# Patient Record
Sex: Male | Born: 1945 | Race: White | Hispanic: No | Marital: Married | Smoking: Never smoker
Health system: Southern US, Community
[De-identification: ages and names within clinical notes are randomized; demographics above are authoritative.]

## PROBLEM LIST (undated history)

## (undated) DIAGNOSIS — I10 Essential (primary) hypertension: Secondary | ICD-10-CM

## (undated) DIAGNOSIS — T7840XA Allergy, unspecified, initial encounter: Secondary | ICD-10-CM

## (undated) DIAGNOSIS — J302 Other seasonal allergic rhinitis: Secondary | ICD-10-CM

## (undated) DIAGNOSIS — E079 Disorder of thyroid, unspecified: Secondary | ICD-10-CM

## (undated) DIAGNOSIS — E039 Hypothyroidism, unspecified: Secondary | ICD-10-CM

## (undated) DIAGNOSIS — R310 Gross hematuria: Secondary | ICD-10-CM

## (undated) DIAGNOSIS — C4491 Basal cell carcinoma of skin, unspecified: Secondary | ICD-10-CM

## (undated) DIAGNOSIS — N189 Chronic kidney disease, unspecified: Secondary | ICD-10-CM

## (undated) HISTORY — DX: Basal cell carcinoma of skin, unspecified: C44.91

## (undated) HISTORY — DX: Disorder of thyroid, unspecified: E07.9

## (undated) HISTORY — DX: Essential (primary) hypertension: I10

## (undated) HISTORY — PX: NO PAST SURGERIES: SHX2092

## (undated) HISTORY — DX: Allergy, unspecified, initial encounter: T78.40XA

---

## 1989-02-17 HISTORY — PX: OTHER SURGICAL HISTORY: SHX169

## 2001-06-24 ENCOUNTER — Encounter: Payer: Self-pay | Admitting: Family Medicine

## 2001-08-24 ENCOUNTER — Encounter: Payer: Self-pay | Admitting: Family Medicine

## 2003-09-25 ENCOUNTER — Encounter: Payer: Self-pay | Admitting: Family Medicine

## 2003-09-25 LAB — CONVERTED CEMR LAB: PSA: 1.3 ng/mL

## 2004-08-27 ENCOUNTER — Ambulatory Visit: Payer: Self-pay | Admitting: Family Medicine

## 2005-10-22 ENCOUNTER — Encounter: Payer: Self-pay | Admitting: Family Medicine

## 2005-11-23 ENCOUNTER — Ambulatory Visit: Payer: Self-pay | Admitting: Family Medicine

## 2006-01-11 ENCOUNTER — Ambulatory Visit: Payer: Self-pay | Admitting: Family Medicine

## 2006-01-29 HISTORY — PX: PROSTATE BIOPSY: SHX241

## 2006-02-12 ENCOUNTER — Ambulatory Visit: Payer: Self-pay | Admitting: Family Medicine

## 2006-04-08 ENCOUNTER — Ambulatory Visit: Payer: Self-pay | Admitting: Family Medicine

## 2006-08-12 ENCOUNTER — Ambulatory Visit: Payer: Self-pay | Admitting: Family Medicine

## 2006-08-26 ENCOUNTER — Encounter: Payer: Self-pay | Admitting: Family Medicine

## 2006-08-30 ENCOUNTER — Ambulatory Visit: Payer: Self-pay | Admitting: Family Medicine

## 2007-01-11 ENCOUNTER — Encounter: Payer: Self-pay | Admitting: Family Medicine

## 2007-01-12 DIAGNOSIS — I10 Essential (primary) hypertension: Secondary | ICD-10-CM | POA: Insufficient documentation

## 2007-01-12 DIAGNOSIS — E039 Hypothyroidism, unspecified: Secondary | ICD-10-CM | POA: Insufficient documentation

## 2007-01-12 DIAGNOSIS — J309 Allergic rhinitis, unspecified: Secondary | ICD-10-CM | POA: Insufficient documentation

## 2007-01-12 DIAGNOSIS — T7840XA Allergy, unspecified, initial encounter: Secondary | ICD-10-CM | POA: Insufficient documentation

## 2007-01-13 ENCOUNTER — Ambulatory Visit: Payer: Self-pay | Admitting: Family Medicine

## 2007-01-14 ENCOUNTER — Encounter: Payer: Self-pay | Admitting: Family Medicine

## 2007-09-23 ENCOUNTER — Encounter: Payer: Self-pay | Admitting: Family Medicine

## 2007-09-26 ENCOUNTER — Ambulatory Visit: Payer: Self-pay | Admitting: Family Medicine

## 2007-09-27 ENCOUNTER — Encounter: Payer: Self-pay | Admitting: Family Medicine

## 2007-12-21 ENCOUNTER — Telehealth: Payer: Self-pay | Admitting: Family Medicine

## 2008-02-28 ENCOUNTER — Encounter: Payer: Self-pay | Admitting: Family Medicine

## 2008-03-01 ENCOUNTER — Ambulatory Visit: Payer: Self-pay | Admitting: Family Medicine

## 2008-03-02 ENCOUNTER — Encounter: Payer: Self-pay | Admitting: Family Medicine

## 2008-03-07 ENCOUNTER — Encounter: Payer: Self-pay | Admitting: Family Medicine

## 2008-10-30 ENCOUNTER — Ambulatory Visit: Payer: Self-pay | Admitting: Family Medicine

## 2009-02-24 ENCOUNTER — Encounter: Payer: Self-pay | Admitting: Family Medicine

## 2009-03-07 ENCOUNTER — Ambulatory Visit: Payer: Self-pay | Admitting: Family Medicine

## 2009-06-06 ENCOUNTER — Ambulatory Visit: Payer: Self-pay | Admitting: Family Medicine

## 2009-11-18 ENCOUNTER — Ambulatory Visit: Payer: Self-pay | Admitting: Family Medicine

## 2009-12-09 ENCOUNTER — Telehealth: Payer: Self-pay | Admitting: Family Medicine

## 2010-03-06 ENCOUNTER — Encounter: Payer: Self-pay | Admitting: Family Medicine

## 2010-03-06 LAB — CONVERTED CEMR LAB
HDL: 53 mg/dL
LDL Cholesterol: 54 mg/dL

## 2010-03-10 ENCOUNTER — Ambulatory Visit: Payer: Self-pay | Admitting: Family Medicine

## 2010-03-26 ENCOUNTER — Encounter (INDEPENDENT_AMBULATORY_CARE_PROVIDER_SITE_OTHER): Payer: Self-pay | Admitting: *Deleted

## 2010-09-21 LAB — CONVERTED CEMR LAB: PSA: 3.3 ng/mL
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
## 2010-09-23 NOTE — Assessment & Plan Note (Signed)
Summary: Richard Ibarra and estabh from dr schaller/dlo   Vital Signs:  Patient profile:   65 year old male Weight:      150 pounds Temp:     98.4 degrees F oral Pulse rate:   60 / minute Pulse rhythm:   regular BP sitting:   120 / 78  (left arm) Cuff size:   regular  Vitals Entered By: Sydell Axon LPN (March 10, 2010 8:18 AM) CC: 30 Minute checkup and to get established with Dr. Dayton Martes   History of Present Illness: 65 yo new to me here for Richard Ibarra.  Works at [employer] and brings a copy of his labs in with him today.  Hypothyroidism- On Synthroid 125 micrograms daily and has been for years.  TSH this month .128, T4 1.83.  Denies any palpitations, diarrhea or other symptoms of hyperthyroidism.  hyperlipidemia- on Crestor 10 mg daily and Niaspan 1000 mg qhs.  Lipids well controlled.  Denies myalgias or other side effects.  BPH- PSA has been stable, followed by urology. Finaseteride 5 mg helps with symptoms.  Denies any urinary complaints.    HTN- well controlled on Benicar 20 mg daily and Meoprolol 50 mg daily.  Denies CP, SOB, LE edema, blurred vision.  Current Medications (verified): 1)  Synthroid 125 Mcg Tabs (Levothyroxine Sodium) .... Take One By Mouth Daily 2)  Niaspan 1000 Mg Cr-Tabs (Niacin (Antihyperlipidemic)) .... One Tab At Night 3)  Fexofenadine Hcl 180 Mg Tabs (Fexofenadine Hcl) .... One By Mouth Once Daily As Needed Springtime Allergies 4)  Crestor 10 Mg Tabs (Rosuvastatin Calcium) .... One Tab By Mouth At Night 5)  Finasteride 5 Mg Tabs (Finasteride) .... One Tab By Mouth Once Daily 6)  Aspir-Low 81 Mg Tbec (Aspirin) .Marland Kitchen.. 1 Daily By Mouth 7)  Benicar 20 Mg Tabs (Olmesartan Medoxomil) .... One Tab By Mouth Once Daily 8)  Metoprolol Tartrate 50 Mg Tabs (Metoprolol Tartrate) .Marland Kitchen.. 1 Daily By Mouth 9)  Synthroid 112 Mcg Tabs (Levothyroxine Sodium) .Marland Kitchen.. 1 Tab By Mouth Daily  Allergies (verified): No Known Drug Allergies  Past History:  Past Medical History: Last updated:  01/11/2007 Allergic rhinitis Hypertension Hypothyroidism  Past Surgical History: Last updated: 01/11/2007 RASH testing- multiple positives 02/17/89 +H. pylori via urea breath test, confirmed + 11/18/01 Head MRI, secondary fatigue/ LE weakness WNL  08/25/02 Prostate bx. 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 Eyes:  Denies blurring. ENT:  Denies decreased hearing and difficulty swallowing. CV:  Denies chest pain or discomfort, shortness of breath with exertion, swelling of feet, and swelling of hands. Resp:  Denies chest discomfort and shortness of breath. GI:  Denies abdominal pain, bloody stools, and change in bowel habits. GU:  Denies erectile dysfunction, hematuria, urinary frequency, and urinary hesitancy. MS:  Denies joint pain, joint redness, and joint swelling. Derm:  Denies rash. Neuro:  Denies headaches. Psych:  Denies anxiety and depression. Endo:  Denies cold intolerance and heat intolerance. Heme:  Denies abnormal bruising.  Physical Exam  General:  Well-developed,well-nourished,in no acute distress; alert,appropriate and cooperative  throughout examination, slim and healthy appearing. Head:  Normocephalic and atraumatic without obvious abnormalities. No apparent alopecia but male pattern  balding. Sinuses NT. Eyes:  Conjunctiva clear bilaterally.  Ears:  External ear exam shows no significant lesions or deformities.  Otoscopic examination reveals  clear canals, tympanic membranes are intact bilaterally without bulging, retraction, inflammation or discharge. Hearing is grossly normal bilaterally. Nose:  External nasal examination shows no deformity or inflammation. Nasal mucosa are pink and moist without lesions or exudates. Mouth:  Oral mucosa and oropharynx without lesions but mild thick white  exudates.  Teeth in good repair. Neck:  No deformities, masses, or tenderness noted. Lungs:  Normal respiratory effort, chest expands symmetrically. Lungs are clear to auscultation, no crackles or wheezes. Heart:  Normal rate and regular rhythm. S1 and S2 normal without gallop, murmur, click, rub or other extra sounds. Abdomen:  Bowel sounds positive,abdomen soft and non-tender without masses, organomegaly or hernias noted. Prostate:  deferred- followed by urology Msk:  No deformity or scoliosis noted of thoracic or lumbar spine.   Extremities:  No clubbing, cyanosis, edema, or deformity noted with normal full range of motion of all joints.   Neurologic:  No cranial nerve deficits noted. Station and gait are normal. Plantar reflexes are down-going bilaterally. DTRs are symmetrical throughout. Sensory, motor and coordinative functions appear intact. Skin:  Intact without suspicious lesions or rashes Psych:  Cognition and judgment appear intact. Alert and cooperative with normal attention span and concentration. No apparent delusions, illusions, hallucinations   Impression & Recommendations:  Problem # 1:  Preventive Health Care (ICD-V70.0) Reviewed preventive care protocols, scheduled due services, and updated immunizations Discussed nutrition, exercise, diet, and healthy lifestyle.  Given Zostavax today. Order placed for colonoscopy today.  Problem # 2:  HYPOTHYROIDISM (ICD-244.9) Assessment: Deteriorated TSH low, will decrease dose of Synthroid to 112 micrograms daily.  Given script ot have rechecked in 2-3 months at [facility]. His updated  medication list for this problem includes:    Synthroid 125 Mcg Tabs (Levothyroxine sodium) .Marland Kitchen... Take one by mouth daily    Synthroid 112 Mcg Tabs (Levothyroxine sodium) .Marland Kitchen... 1 tab by mouth daily  Problem # 3:  HYPERCHOLESTEROLEMIA/ LDL 152 (ICD-272.0) Assessment: Unchanged stable.  Continue current meds. His updated medication list for this problem includes:    Niaspan 1000 Mg Cr-tabs (Niacin (antihyperlipidemic)) ..... One tab at night    Crestor 10 Mg Tabs (Rosuvastatin calcium) ..... One tab by mouth at night  Problem # 4:  HYPERTENSION (ICD-401.9) Assessment: Unchanged continue current meds.   His updated medication list for this problem includes:    Benicar 20 Mg Tabs (Olmesartan medoxomil) ..... One tab by mouth once daily    Metoprolol Tartrate 50 Mg Tabs (Metoprolol tartrate) .Marland Kitchen... 1 daily by mouth  Complete Medication List: 1)  Synthroid 125 Mcg Tabs (Levothyroxine sodium) .... Take one by mouth daily 2)  Niaspan 1000 Mg Cr-tabs (Niacin (antihyperlipidemic)) .... One tab at night 3)  Fexofenadine Hcl 180 Mg Tabs (Fexofenadine hcl) .... One by mouth once daily as needed springtime allergies 4)  Crestor 10 Mg Tabs (Rosuvastatin calcium) .... One tab by mouth at night 5)  Finasteride 5 Mg Tabs (Finasteride) .... One tab by mouth once daily 6)  Aspir-low 81 Mg Tbec (Aspirin) .Marland Kitchen.. 1 daily by mouth 7)  Benicar 20 Mg Tabs (Olmesartan medoxomil) .... One tab by mouth once daily 8)  Metoprolol Tartrate 50 Mg Tabs (Metoprolol tartrate) .Marland Kitchen.. 1 daily by mouth 9)  Synthroid 112  Mcg Tabs (Levothyroxine sodium) .Marland Kitchen.. 1 tab by mouth daily  Other Orders: Gastroenterology Referral (GI) Zoster (Shingles) Vaccine Live [phone]) Admin 1st Vaccine ([id number])  Patient Instructions: 1)  Great to meet you. 2)  Please stop by to see Shirlee Limerick on your way out to set up your GI referral. Prescriptions: SYNTHROID 112 MCG TABS (LEVOTHYROXINE SODIUM) 1 tab by mouth daily  #90 x 3   Entered and Authorized  by:   Ruthe Mannan MD   Signed by:   Ruthe Mannan MD on 03/10/2010   Method used:   Print then Give to Patient   RxID:   [id number]   Flu Vaccine Next Due:  Not Indicated Herpes Zoster Result Date:  03/10/2010 Herpes Zoster Result:  given HDL Result Date:  03/06/2010 HDL Result:  53 HDL Next Due:  1 yr LDL Result Date:  03/06/2010 LDL Result:  54 LDL Next Due:  1 yr   Current Allergies (reviewed today): No known allergies     Immunizations Administered:  Zostavax # 1:    Vaccine Type: Zostavax    Site: left deltoid    Mfr: Merck    Dose: 1.0 ml    Route: [place]    Given by: Janee Morn CMA    Exp. Date: 03/19/2011    Lot #: [id number]    VIS given: 06/05/05 given March 10, 2010.    Prevention & Chronic Care Immunizations   Influenza vaccine: Not documented   Influenza vaccine due: Not Indicated    Tetanus booster: 09/05/2001: Td   Tetanus booster due: 09/06/2011    Pneumococcal vaccine: Not documented    H. zoster vaccine: 03/10/2010: given  Colorectal Screening   Hemoccult: Not documented   Hemoccult action/deferral: Not indicated  (03/10/2010)    Colonoscopy: Not documented   Colonoscopy action/deferral: Refused  (03/10/2010)  Other Screening   PSA: 2.3  (09/23/2007)   PSA action/deferral: Not indicated  (03/10/2010)   PSA due due: 09/22/2008   Smoking status: never  (01/11/2007)  Lipids   Total Cholesterol: Not documented   Lipid panel action/deferral: Refused   LDL: 54  (03/06/2010)   LDL Direct: Not documented   HDL: 53  (03/06/2010)   Triglycerides: Not documented    SGOT (AST): Not documented   BMP action: Not indicated   SGPT (ALT): Not documented   Alkaline phosphatase: Not documented   Total bilirubin: Not documented    Lipid flowsheet reviewed?: Yes   Progress toward LDL goal: At goal  Hypertension   Last Blood Pressure: 120 / 78  (03/10/2010)   Serum creatinine: Not documented   BMP action: Not indicated   Serum potassium Not  documented    Hypertension flowsheet reviewed?: Yes   Progress toward BP goal: At goal  Self-Management Support :    Hypertension self-management support: Not documented    Lipid self-management support: Not documented

## 2010-09-23 NOTE — Letter (Signed)
Summary: Nadara Eaton letter  [place]  [address] [place], [place] [address]   Phone: [phone]  Fax: [phone]       03/26/2010 MRN: [id number]  [facility] [address] [place], [place]  [address]  Dear Mr. [facility] [place] announce the retirement of Arta Silence, M.D., from full-time practice at the [facility] office effective February 20, 2010 and his plans of returning part-time.  It is important to Dr. Hetty Ely and to our practice that you understand that [facility] has seven physicians in our office for your health care needs.  We will continue to offer the same exceptional care that you have today.    Dr. Hetty Ely has spoken to many of you about his plans for retirement and returning part-time in the fall.   We will continue to work with you through the transition to schedule appointments for you in the office and meet the high standards that [facility] is committed to.   Again, it is with great pleasure that we share the news that Dr. Hetty Ely will return to [facility] in October of 2011 with a reduced schedule.    If you have any questions, or would like to request an appointment with one of our physicians, please call us at [phone] and press the option for Scheduling an appointment.  We take pleasure in providing you with excellent patient care and look forward to seeing you at your next office visit.  Our [facility] Physicians are:  Tillman Abide, M.D. Laurita Quint, M.D. Roxy Manns, M.D. Kerby Nora, M.D. Hannah Beat, M.D. Ruthe Mannan, M.D. We proudly welcomed Raechel Ache, M.D. and Eustaquio Boyden, M.D. to the practice in July/August 2011.  Sincerely,  [facility]

## 2010-09-23 NOTE — Progress Notes (Signed)
Summary: refill request for finasteride, crestor  Phone Note Refill Request Message from:  Fax from Pharmacy  Refills Requested: Medication #1:  FINASTERIDE 5 MG TABS one tab by mouth once daily  Medication #2:  CRESTOR 10 MG TABS one tab by mouth at night Faxed forms from [facility] is on your shelf.  Initial call taken by: Lowella Petties CMA,  December 09, 2009 9:24 AM  Follow-up for Phone Call        forms and scripts faxed Follow-up by: Lowella Petties CMA,  December 09, 2009 10:40 AM    Prescriptions: FINASTERIDE 5 MG TABS (FINASTERIDE) one tab by mouth once daily  #90 x 3   Entered and Authorized by:   Shaune Leeks MD   Signed by:   Shaune Leeks MD on 12/09/2009   Method used:   Printed then faxed to ...         RxID:   [id number] CRESTOR 10 MG TABS (ROSUVASTATIN CALCIUM) one tab by mouth at night  #90 x 3   Entered and Authorized by:   Shaune Leeks MD   Signed by:   Shaune Leeks MD on 12/09/2009   Method used:   Printed then faxed to ...         RxID:   [id number]

## 2010-10-30 ENCOUNTER — Encounter: Payer: Self-pay | Admitting: Family Medicine

## 2010-10-30 ENCOUNTER — Ambulatory Visit (INDEPENDENT_AMBULATORY_CARE_PROVIDER_SITE_OTHER): Payer: 59 | Admitting: Family Medicine

## 2010-10-30 DIAGNOSIS — I10 Essential (primary) hypertension: Secondary | ICD-10-CM

## 2010-10-30 DIAGNOSIS — E039 Hypothyroidism, unspecified: Secondary | ICD-10-CM

## 2010-11-04 NOTE — Assessment & Plan Note (Signed)
Summary: REFILL MEDICATIONS/CLE  [facility]   Vital Signs:  Patient profile:   65 year old male Weight:      153.75 pounds BMI:     24.17 Temp:     97.7 degrees F oral Pulse rate:   60 / minute Pulse rhythm:   regular BP sitting:   104 / 64  (left arm) Cuff size:   regular  Vitals Entered By: Sydell Axon LPN (October 29, 3873 9:13 AM) CC: Wants refills on medications   History of Present Illness: Pt here for medication renewals and lab request at [facility] for labs prior to scheduled Comp Exam coming up. He feels well and has no complaints. He has been doing lots of travelling via work, most interesting place to [place]. He saw Dr Dayton Martes a while back and had his thyroid dose readjusted and is due a lab recheck of that soon.  Problems Prior to Update: 1)  Preventive Health Care  (ICD-V70.0) 2)  Special Screening For Malignant Neoplasms Colon  (ICD-V76.51) 3)  Other Specified Counseling, Travel  (ICD-V65.49) 4)  Health Maintenance Exam  (ICD-V70.0) 5)  Elevated Prostate Specific Antigen S/p Bx  (ICD-790.93) 6)  Allergy  (ICD-995.3) 7)  Hypercholesterolemia/ Ldl 152  (ICD-272.0) 8)  Hypothyroidism  (ICD-244.9) 9)  Hypertension  (ICD-401.9) 10)  Allergic Rhinitis  (ICD-477.9)  Medications Prior to Update: 1)  Synthroid 125 Mcg Tabs (Levothyroxine Sodium) .... Take One By Mouth Daily 2)  Niaspan 1000 Mg Cr-Tabs (Niacin (Antihyperlipidemic)) .... One Tab At Night 3)  Fexofenadine Hcl 180 Mg Tabs (Fexofenadine Hcl) .... One By Mouth Once Daily As Needed Springtime Allergies 4)  Crestor 10 Mg Tabs (Rosuvastatin Calcium) .... One Tab By Mouth At Night 5)  Finasteride 5 Mg Tabs (Finasteride) .... One Tab By Mouth Once Daily 6)  Aspir-Low 81 Mg Tbec (Aspirin) .Marland Kitchen.. 1 Daily By Mouth 7)  Benicar 20 Mg Tabs (Olmesartan Medoxomil) .... One Tab By Mouth Once Daily 8)  Metoprolol Tartrate 50 Mg Tabs (Metoprolol Tartrate) .Marland Kitchen.. 1 Daily By Mouth 9)  Synthroid 112 Mcg Tabs (Levothyroxine Sodium) .Marland Kitchen.. 1  Tab By Mouth Daily  Current Medications (verified): 1)  Niaspan 1000 Mg Cr-Tabs (Niacin (Antihyperlipidemic)) .... One Tab At Night 2)  Fexofenadine Hcl 180 Mg Tabs (Fexofenadine Hcl) .... One By Mouth Once Daily As Needed Springtime Allergies 3)  Crestor 10 Mg Tabs (Rosuvastatin Calcium) .... One Tab By Mouth At Night 4)  Finasteride 5 Mg Tabs (Finasteride) .... One Tab By Mouth Once Daily 5)  Aspir-Low 81 Mg Tbec (Aspirin) .Marland Kitchen.. 1 Daily By Mouth 6)  Benicar 20 Mg Tabs (Olmesartan Medoxomil) .... One Tab By Mouth Once Daily 7)  Metoprolol Tartrate 50 Mg Tabs (Metoprolol Tartrate) .Marland Kitchen.. 1 Daily By Mouth 8)  Synthroid 112 Mcg Tabs (Levothyroxine Sodium) .Marland Kitchen.. 1 Tab By Mouth Daily  Allergies: No Known Drug Allergies  Physical Exam  General:  Well-developed,well-nourished,in no acute distress; alert,appropriate and cooperative throughout examination, slim and healthy appearing. Lungs:  Normal respiratory effort, chest expands symmetrically. Lungs are clear to auscultation, no crackles or wheezes. Heart:  Normal rate and regular rhythm. S1 and S2 normal without gallop, murmur, click, rub or other extra sounds.   Impression & Recommendations:  Problem # 1:  HYPOTHYROIDISM (ICD-244.9) Assessment Unchanged  Will recheck with lab soon. The following medications were removed from the medication list:    Synthroid 125 Mcg Tabs (Levothyroxine sodium) .Marland Kitchen... Take one by mouth daily His updated medication list for this problem includes:  Synthroid 112 Mcg Tabs (Levothyroxine sodium) .Marland Kitchen... 1 tab by mouth daily  Labs Reviewed: HDL: 53 (03/06/2010)   LDL: 54 (03/06/2010)     Problem # 2:  HYPERTENSION (ICD-401.9) Assessment: Unchanged Good control, cont curr meds. Given script for labs prior to next visit. His updated medication list for this problem includes:    Benicar 20 Mg Tabs (Olmesartan medoxomil) ..... One tab by mouth once daily    Metoprolol Tartrate 50 Mg Tabs (Metoprolol tartrate)  .Marland Kitchen... 1 daily by mouth  BP today: 104/64 Prior BP: 120/78 (03/10/2010)  Labs Reviewed: HDL: 53 (03/06/2010)   LDL: 54 (03/06/2010)     Complete Medication List: 1)  Niaspan 1000 Mg Cr-tabs (Niacin (antihyperlipidemic)) .... One tab at night 2)  Fexofenadine Hcl 180 Mg Tabs (Fexofenadine hcl) .... One by mouth once daily as needed springtime allergies 3)  Crestor 10 Mg Tabs (Rosuvastatin calcium) .... One tab by mouth at night 4)  Finasteride 5 Mg Tabs (Finasteride) .... One tab by mouth once daily 5)  Aspir-low 81 Mg Tbec (Aspirin) .Marland Kitchen.. 1 daily by mouth 6)  Benicar 20 Mg Tabs (Olmesartan medoxomil) .... One tab by mouth once daily 7)  Metoprolol Tartrate 50 Mg Tabs (Metoprolol tartrate) .Marland Kitchen.. 1 daily by mouth 8)  Synthroid 112 Mcg Tabs (Levothyroxine sodium) .Marland Kitchen.. 1 tab by mouth daily  Patient Instructions: 1)  RTC for Comp Exam.  2)  Pneumovax next time. Prescriptions: SYNTHROID 112 MCG TABS (LEVOTHYROXINE SODIUM) 1 tab by mouth daily  #90 x 3   Entered by:   Sydell Axon LPN   Authorized by:   Shaune Leeks MD   Signed by:   Sydell Axon LPN on 40/98/1191   Method used:   Print then Give to Patient   RxID:   [id number] METOPROLOL TARTRATE 50 MG TABS (METOPROLOL TARTRATE) 1 daily by mouth  #90 x 3   Entered by:   Sydell Axon LPN   Authorized by:   Shaune Leeks MD   Signed by:   Sydell Axon LPN on 46/96/2952   Method used:   Print then Give to Patient   RxID:   [id number] BENICAR 20 MG TABS (OLMESARTAN MEDOXOMIL) one tab by mouth once daily  #90 x 3   Entered by:   Sydell Axon LPN   Authorized by:   Shaune Leeks MD   Signed by:   Sydell Axon LPN on 64/40/3474   Method used:   Print then Give to Patient   RxID:   [id number] FINASTERIDE 5 MG TABS (FINASTERIDE) one tab by mouth once daily  #90 x 3   Entered by:   Sydell Axon LPN   Authorized by:   Shaune Leeks MD   Signed by:   Sydell Axon LPN on 18/84/1660   Method used:   Print  then Give to Patient   RxID:   [id number] CRESTOR 10 MG TABS (ROSUVASTATIN CALCIUM) one tab by mouth at night  #90 x 3   Entered by:   Sydell Axon LPN   Authorized by:   Shaune Leeks MD   Signed by:   Sydell Axon LPN on 22/09/5425   Method used:   Print then Give to Patient   RxID:   [id number] FEXOFENADINE HCL 180 MG TABS (FEXOFENADINE HCL) one by mouth once daily as needed springtime allergies  #90 x 3   Entered by:   Sydell Axon LPN   Authorized by:   Shaune Leeks MD  Signed by:   Sydell Axon LPN on 81/19/1478   Method used:   Print then Give to Patient   RxID:   [id number] NIASPAN 1000 MG CR-TABS (NIACIN (ANTIHYPERLIPIDEMIC)) one tab at night  #90 x 3   Entered by:   Sydell Axon LPN   Authorized by:   Shaune Leeks MD   Signed by:   Sydell Axon LPN on 62/95/2841   Method used:   Print then Give to Patient   RxID:   [id number]    Orders Added: 1)  Est. Patient Level III [03474]    Current Allergies (reviewed today): No known allergies

## 2010-12-15 ENCOUNTER — Other Ambulatory Visit: Payer: Self-pay | Admitting: Family Medicine

## 2011-01-15 ENCOUNTER — Encounter: Payer: Self-pay | Admitting: Family Medicine

## 2011-01-16 ENCOUNTER — Ambulatory Visit: Payer: 59 | Admitting: Family Medicine

## 2011-06-18 ENCOUNTER — Telehealth: Payer: Self-pay | Admitting: *Deleted

## 2011-06-18 NOTE — Telephone Encounter (Signed)
PRior auth form filled out.

## 2011-06-18 NOTE — Telephone Encounter (Signed)
Form faxed to [facility]

## 2011-06-18 NOTE — Telephone Encounter (Signed)
Prior Berkley Harvey is needed for crestor, or change to a formulary alternative.  Forms to change or get prior auth are on your desk.

## 2011-06-26 ENCOUNTER — Other Ambulatory Visit: Payer: Self-pay | Admitting: *Deleted

## 2011-06-26 MED ORDER — ROSUVASTATIN CALCIUM 10 MG PO TABS: 10.0000 mg | ORAL_TABLET | Freq: Every day | ORAL | Status: DC

## 2011-08-28 ENCOUNTER — Encounter: Payer: Self-pay | Admitting: Family Medicine

## 2011-08-28 ENCOUNTER — Ambulatory Visit (INDEPENDENT_AMBULATORY_CARE_PROVIDER_SITE_OTHER): Payer: 59 | Admitting: Family Medicine

## 2011-08-28 VITALS — BP 138/82 | HR 71 | Temp 98.1°F | Resp 14 | Ht 66.0 in | Wt 154.2 lb

## 2011-08-28 DIAGNOSIS — R310 Gross hematuria: Secondary | ICD-10-CM

## 2011-08-28 LAB — POCT URINALYSIS DIPSTICK
Blood, UA: NEGATIVE
Glucose, UA: NEGATIVE
Ketones, UA: NEGATIVE
Spec Grav, UA: <=1.005
Urobilinogen, UA: 0.2

## 2011-08-28 NOTE — Progress Notes (Signed)
Subjective:    Patient ID: Richard Ibarra, male    DOB: 03/10/1946, 66 y.o.   MRN: [id number]  HPI CC: gross hematuria  Active in scouts.  Spring 2012 [place].  2d later, hematuria - started passing clots.  Within 48 hours resolved.  Mid fall 2012 carrying heavy Surveyor, quantity at home, afterwards with blood clots again which resolved after 1-2 days.  Last week again carrying heavy Surveyor, quantity and again noted gross hematuria with less clots, more gross blood.  This time bleeding lasted longer - 4-5 days.  Also had LUTS: urgency, frequency, discomfort and straining at end of stream.  No abd pain, back pain, rectal pain, n/v, fever.  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  Taking aspirin longterm, stopped with noted blood.  Some ecchymosis with light trauma but attributed to aspirin.  No other bleeding noted.  Has made appt with Dr. Patsi Sears for February.  H/o BPH, on finasteride.  H/o prostate biopsy in past.  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  Medications and allergies reviewed and updated in chart.  Past histories reviewed and updated if relevant as below. Patient Active Problem List  Diagnoses  . HYPOTHYROIDISM  . HYPERTENSION  . ALLERGIC RHINITIS  . ALLERGY   Past Medical History  Diagnosis Date  . Allergy   . Hypertension   . Thyroid disease    Past Surgical History  Procedure Date  . Rash testing 02/17/1989    multiple positives  . Prostate biopsy 01/29/2006    Dr. Patsi Sears   History  Substance Use Topics  . Smoking status: Not on file  . Smokeless tobacco: Not on file  . Alcohol Use:    Family History  Problem Relation Age of Onset  . Cerebral aneurysm Father    No Known Allergies Current Outpatient Prescriptions on File Prior to Visit  Medication Sig Dispense Refill  .  fexofenadine (ALLEGRA) 180 MG tablet Take 180 mg by mouth daily.        . finasteride (PROSCAR) 5 MG tablet Take 5 mg by mouth daily.        Marland Kitchen levothyroxine (SYNTHROID, LEVOTHROID) 112 MCG tablet TAKE 1 TABLET BY MOUTH DAILY  90 tablet  2  . metoprolol (LOPRESSOR) 50 MG tablet Take 50 mg by mouth daily.        . niacin (NIASPAN) 1000 MG CR tablet Take 1,000 mg by mouth at bedtime.        Marland Kitchen olmesartan (BENICAR) 20 MG tablet Take 20 mg by mouth daily.        . rosuvastatin (CRESTOR) 10 MG tablet Take 1 tablet (10 mg total) by mouth at bedtime.  90 tablet  0  . aspirin 81 MG tablet Take 81 mg by mouth daily.         Review of Systems per HPI   Objective:   Physical Exam  Nursing note and vitals reviewed. Constitutional: He appears well-developed and well-nourished. No distress.  HENT:  Head: Normocephalic and atraumatic.  Mouth/Throat: Oropharynx is clear and moist. No oropharyngeal exudate.  Cardiovascular: Normal rate, regular rhythm, normal heart sounds and intact distal pulses.   No murmur heard. Pulmonary/Chest: Effort normal and breath sounds normal. No respiratory distress. He has no wheezes. He has no rales.  Abdominal: Soft. Bowel sounds are normal. He exhibits no distension. There is  no hepatosplenomegaly. There is no tenderness. There is no rebound, no guarding and no CVA tenderness.  Musculoskeletal: He exhibits no edema.  Skin: Skin is warm and dry. No rash noted.  Psychiatric: He has a normal mood and affect.       Assessment & Plan:

## 2011-08-28 NOTE — Patient Instructions (Signed)
I will send urine culture today as well as fax over office note and results to Dr. Patsi Sears. Pass by [facility] office to see if we can get you in sooner with urology. Let me know if symptoms returning. Get blood work done in next 1-2 weeks to check kidneys.

## 2011-08-29 ENCOUNTER — Encounter: Payer: Self-pay | Admitting: Family Medicine

## 2011-08-29 NOTE — Assessment & Plan Note (Signed)
Story consistent with strenous exercise -induced hematuria. However given age and recent episode with LUTS, will ask to see urology for evaluation. Never smoker. Will send urine culture today and will also have him check blood work to eval ARF, rhabdo.

## 2011-08-30 LAB — URINE CULTURE: Colony Count: 2000

## 2011-09-28 ENCOUNTER — Encounter: Payer: Self-pay | Admitting: Family Medicine

## 2011-09-29 ENCOUNTER — Encounter: Payer: Self-pay | Admitting: *Deleted

## 2011-09-30 ENCOUNTER — Encounter: Payer: Self-pay | Admitting: Family Medicine

## 2011-10-28 ENCOUNTER — Other Ambulatory Visit: Payer: Self-pay | Admitting: Urology

## 2011-10-29 MED ORDER — BELLADONNA ALKALOIDS-OPIUM 16.2-60 MG RE SUPP: 1.0000 | Freq: Once | RECTAL | Status: DC

## 2011-10-30 DIAGNOSIS — R310 Gross hematuria: Secondary | ICD-10-CM

## 2011-10-30 HISTORY — DX: Gross hematuria: R31.0

## 2011-11-02 ENCOUNTER — Encounter (HOSPITAL_COMMUNITY): Payer: Self-pay | Admitting: Pharmacy Technician

## 2011-11-02 ENCOUNTER — Ambulatory Visit (HOSPITAL_COMMUNITY)
Admission: RE | Admit: 2011-11-02 | Discharge: 2011-11-02 | Disposition: A | Discharge status: Home / Self Care | Payer: 59 | Source: Ambulatory Visit | Attending: Urology | Admitting: Urology

## 2011-11-02 ENCOUNTER — Encounter (HOSPITAL_COMMUNITY): Payer: Self-pay

## 2011-11-02 ENCOUNTER — Other Ambulatory Visit: Payer: Self-pay

## 2011-11-02 ENCOUNTER — Encounter (HOSPITAL_COMMUNITY)
Admission: RE | Admit: 2011-11-02 | Discharge: 2011-11-02 | Disposition: A | Discharge status: Home / Self Care | Payer: 59 | Source: Ambulatory Visit | Attending: Urology | Admitting: Urology

## 2011-11-02 DIAGNOSIS — Z01810 Encounter for preprocedural cardiovascular examination: Secondary | ICD-10-CM | POA: Insufficient documentation

## 2011-11-02 DIAGNOSIS — N210 Calculus in bladder: Secondary | ICD-10-CM | POA: Insufficient documentation

## 2011-11-02 DIAGNOSIS — I498 Other specified cardiac arrhythmias: Secondary | ICD-10-CM | POA: Insufficient documentation

## 2011-11-02 DIAGNOSIS — Z01812 Encounter for preprocedural laboratory examination: Secondary | ICD-10-CM | POA: Insufficient documentation

## 2011-11-02 DIAGNOSIS — Z01818 Encounter for other preprocedural examination: Secondary | ICD-10-CM | POA: Insufficient documentation

## 2011-11-02 DIAGNOSIS — J302 Other seasonal allergic rhinitis: Secondary | ICD-10-CM

## 2011-11-02 HISTORY — DX: Other seasonal allergic rhinitis: J30.2

## 2011-11-02 HISTORY — DX: Gross hematuria: R31.0

## 2011-11-02 LAB — CBC
HCT: 42.6 % (ref 39.0–52.0)
Hemoglobin: 14.8 g/dL (ref 13.0–17.0)
MCHC: 34.7 g/dL (ref 30.0–36.0)
RBC: 4.85 MIL/uL (ref 4.22–5.81)
WBC: 6.1 10*3/uL (ref 4.0–10.5)

## 2011-11-02 LAB — BASIC METABOLIC PANEL
BUN: 22 mg/dL (ref 6–23)
Chloride: 104 mEq/L (ref 96–112)
GFR calc Af Amer: 78 mL/min — ABNORMAL LOW (ref >90)
GFR calc non Af Amer: 67 mL/min — ABNORMAL LOW (ref >90)
Potassium: 4.2 mEq/L (ref 3.5–5.1)
Sodium: 138 mEq/L (ref 135–145)

## 2011-11-02 LAB — SURGICAL PCR SCREEN
MRSA, PCR: NEGATIVE
Staphylococcus aureus: NEGATIVE

## 2011-11-02 NOTE — Pre-Procedure Instructions (Signed)
11-02-11 EKG/ CXR done today.

## 2011-11-02 NOTE — Patient Instructions (Addendum)
20 DAYON WITT  11/02/2011   Your procedure is scheduled on: 11-09-11  Report to [facility] at      0800  AM.  Call this number if you have problems the morning of surgery: [phone]   Remember:   Do not eat food:After Midnight.   Take these medicines the morning of surgery with A SIP OF WATER: Allegra, Proscar, Levothyroxine, Metoprolol   Do not wear jewelry, make-up or nail polish.  Do not wear lotions, powders, or perfumes. You may wear deodorant.  Do not shave 48 hours prior to surgery.(face and neck okay)  Do not bring valuables to the hospital.  Contacts, dentures or bridgework may not be worn into surgery.  Leave suitcase in the car. After surgery it may be brought to your room.  For patients admitted to the hospital, checkout time is 11:00 AM the day of discharge.   Patients discharged the day of surgery will not be allowed to drive home.  Name and phone number of your driver: spouse  Special Instructions: CHG Shower Use Special Wash: 1/2 bottle night before surgery and 1/2 bottle morning of surgery.(avoid face and genetalia)

## 2011-11-04 ENCOUNTER — Encounter: Payer: Self-pay | Admitting: Family Medicine

## 2011-11-04 ENCOUNTER — Ambulatory Visit (INDEPENDENT_AMBULATORY_CARE_PROVIDER_SITE_OTHER): Payer: 59 | Admitting: Family Medicine

## 2011-11-04 VITALS — BP 132/84 | HR 56 | Temp 97.6°F | Wt 149.0 lb

## 2011-11-04 DIAGNOSIS — R310 Gross hematuria: Secondary | ICD-10-CM

## 2011-11-04 DIAGNOSIS — Z23 Encounter for immunization: Secondary | ICD-10-CM

## 2011-11-04 DIAGNOSIS — E039 Hypothyroidism, unspecified: Secondary | ICD-10-CM

## 2011-11-04 DIAGNOSIS — Z0000 Encounter for general adult medical examination without abnormal findings: Secondary | ICD-10-CM | POA: Insufficient documentation

## 2011-11-04 DIAGNOSIS — I10 Essential (primary) hypertension: Secondary | ICD-10-CM

## 2011-11-04 MED ORDER — ROSUVASTATIN CALCIUM 10 MG PO TABS: 10.0000 mg | ORAL_TABLET | Freq: Every day | ORAL | Status: DC

## 2011-11-04 MED ORDER — FINASTERIDE 5 MG PO TABS: 5.0000 mg | ORAL_TABLET | Freq: Every day | ORAL | Status: DC

## 2011-11-04 MED ORDER — NIACIN ER (ANTIHYPERLIPIDEMIC) 1000 MG PO TBCR: 1000.0000 mg | EXTENDED_RELEASE_TABLET | Freq: Every day | ORAL | Status: DC

## 2011-11-04 MED ORDER — METOPROLOL TARTRATE 50 MG PO TABS: 50.0000 mg | ORAL_TABLET | Freq: Every day | ORAL | Status: DC

## 2011-11-04 MED ORDER — FEXOFENADINE HCL 180 MG PO TABS: 180.0000 mg | ORAL_TABLET | Freq: Every day | ORAL | Status: DC

## 2011-11-04 MED ORDER — LEVOTHYROXINE SODIUM 112 MCG PO TABS: ORAL_TABLET | ORAL | Status: DC

## 2011-11-04 MED ORDER — OLMESARTAN MEDOXOMIL 20 MG PO TABS: 20.0000 mg | ORAL_TABLET | Freq: Every day | ORAL | Status: DC

## 2011-11-04 NOTE — Progress Notes (Signed)
Subjective:    Patient ID: Richard Ibarra, male    DOB: [birth year], 66 y.o.   MRN: [id number]  HPI  66 yo male new to me here for CPX.  Bladder stone- had cystoscopy last week and having stone removal next week with Dr. Patsi Sears.  HTN-  On Benicar 20 mg and Metroprolol 50 mg daily.  BP stable. Lab Results  Component Value Date   CREATININE 1.11 11/02/2011   HLD-  On niaspan 1000 mg daily and Crestor 10 mg daily. Labs scanned from [facility]- LDL 48, HDL 36, TG 87.  Hypothyroidism- Remains on synthroid 112 mcg daily. TSH low but FT4 within normal limits- 1.53. Pt denies any symptoms of hypo or hyperthyroidism.  Going to [place] this summer! Wants to wait to have his colonoscopy after his return- very interested in virtual colonoscopy.  Patient Active Problem List  Diagnoses  . HYPOTHYROIDISM  . HYPERTENSION  . ALLERGIC RHINITIS  . ALLERGY  . Gross hematuria  . Routine general medical examination at a health care facility   Past Medical History  Diagnosis Date  . Allergy   . Hypertension   . Thyroid disease   . Seasonal allergies 11-02-11    Tx. Allegra daily  . Gross hematuria 10-30-11    story consistent with exercise induced, eval by uro with 1.9cm stone L kidney   Past Surgical History  Procedure Date  . Rash testing 02/17/1989    multiple positives  . Prostate biopsy 01/29/2006    Dr. Patsi Sears   History  Substance Use Topics  . Smoking status: Never Smoker   . Smokeless tobacco: Not on file  . Alcohol Use: Not on file   Family History  Problem Relation Age of Onset  . Cerebral aneurysm Father    Allergies  Allergen Reactions  . Banana Swelling  . Food Swelling    All melons    Current Outpatient Prescriptions on File Prior to Visit  Medication Sig Dispense Refill  . diphenhydrAMINE (BENADRYL) 25 mg capsule Take 25 mg by mouth every 6 (six) hours as needed. Itching      . fexofenadine (ALLEGRA) 180 MG tablet Take 180 mg by mouth daily.          . finasteride (PROSCAR) 5 MG tablet Take 5 mg by mouth daily.        Marland Kitchen ibuprofen (ADVIL,MOTRIN) 200 MG tablet Take 400 mg by mouth every 6 (six) hours as needed. PAIN      . levothyroxine (SYNTHROID, LEVOTHROID) 112 MCG tablet       . metoprolol (LOPRESSOR) 50 MG tablet Take 50 mg by mouth daily before breakfast.       . niacin (NIASPAN) 1000 MG CR tablet Take 1,000 mg by mouth at bedtime.        Marland Kitchen olmesartan (BENICAR) 20 MG tablet Take 20 mg by mouth at bedtime.       . rosuvastatin (CRESTOR) 10 MG tablet Take 1 tablet (10 mg total) by mouth at bedtime.  90 tablet  0   Current Facility-Administered Medications on File Prior to Visit  Medication Dose Route Frequency Provider Last Rate Last Dose  . opium-belladonna (B&O SUPPRETTES) suppository 1 suppository  1 suppository Rectal Once Kathi Ludwig, MD       The PMH, PSH, Social History, Family History, Medications, and allergies have been reviewed in [facility], and have been updated if relevant.   Review of Systems    See HPI Patient reports no  vision/ hearing  changes,anorexia, weight change, fever ,adenopathy, persistant / recurrent hoarseness, swallowing issues, chest pain, edema,persistant / recurrent cough, hemoptysis, dyspnea(rest, exertional, paroxysmal nocturnal), gastrointestinal  bleeding (melena, rectal bleeding), abdominal pain, excessive heart burn, GU symptoms(dysuria, hematuria, pyuria, voiding/incontinence  Issues) syncope, focal weakness, severe memory loss, concerning skin lesions, depression, anxiety, abnormal bruising/bleeding, major joint swelling.    Objective:   Physical Exam BP 132/84  Pulse 56  Temp(Src) 97.6 F (36.4 C) (Oral)  Wt 149 lb (67.586 kg)  .General:  overweght male in NAD Eyes:  PERRL Ears:  External ear exam shows no significant lesions or deformities.  Otoscopic examination reveals clear canals, tympanic membranes are intact bilaterally without bulging, retraction, inflammation or discharge.  Hearing is grossly normal bilaterally. Nose:  External nasal examination shows no deformity or inflammation. Nasal mucosa are pink and moist without lesions or exudates. Mouth:  Oral mucosa and oropharynx without lesions or exudates.  Teeth in good repair. Neck:  no carotid bruit or thyromegaly no cervical or supraclavicular lymphadenopathy  Lungs:  Normal respiratory effort, chest expands symmetrically. Lungs are clear to auscultation, no crackles or wheezes. Heart:  Normal rate and regular rhythm. S1 and S2 normal without gallop, murmur, click, rub or other extra sounds. Abdomen:  Bowel sounds positive,abdomen soft and non-tender without masses, organomegaly or hernias noted. Prostate:  Deferred- recently followed by urology Pulses:  R and L posterior tibial pulses are full and equal bilaterally  Extremities:  no edema     Assessment & Plan:   1. Routine general medical examination at a health care facility  Reviewed preventive care protocols, scheduled due services, and updated immunizations Discussed nutrition, exercise, diet, and healthy lifestyle.   2. HYPERTENSION  Stable on current meds.  3. HYPOTHYROIDISM  TSH low but FT4 stable so will continue current dose of synthroid.  4. Gross hematuria  Due to bladder stone, scheduled for removal.

## 2011-11-08 NOTE — H&P (Signed)
Problems  1. 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 Gross Hematuria 599.71 3. Nocturia 788.43 4. PSA,Elevated 790.93 5. Urinary Stream Is Smaller 788.62  History of Present Illness        66 yo male returns today for a cystoscopy & to review CT scan results.  Originally referred by Dr.Gutierrez for further evaluation of gross hematuria & prostate exam.  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  Currently taking Finasteride since 2007.      Pt first noted gross hematuria while hiking [place] 9 months ago. Gross hematuria wiht clots x 48 hrs only. 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 He saw Dr. Sharen Hones [facility]), with negative c/s and referred for evaluation. No tobacco. No family hx of cancer. He is employed as Holiday representative at [employer].   09/25/11  PSA - 1.5  (true PSA 3.0 due to taking Finasteride) (Dr. Hetty Ely)  08/28/11  C&S - negative    Past Medical History Problems  1. History of  Hypercholesterolemia 272.0 2. History of  Hypertension 401.9 3. History of  Hyperthyroidism 242.90  Surgical History Problems  1. History of  No Surgical Problems  Current Meds 1. Allegra CAPS; Therapy: (Recorded:06Mar2013) to 2. Benicar 20 MG Oral Tablet; Therapy: (Recorded:05Feb2013) to 3. Crestor 10 MG Oral Tablet; Therapy: (Recorded:05Feb2008) to 4. Finasteride 5 MG Oral Tablet; TAKE 1 TABLET DAILY AS DIRECTED; Therapy: 05Feb2008 to  (Evaluate:15Jul2010); Last Rx:15Jul2009 5. Levothyroxine Sodium 112 MCG Oral Tablet; Therapy: (Recorded:05Feb2013) to 6. Metoprolol Tartrate 50 MG Oral Tablet; Therapy: (Recorded:05Feb2013) to 7. Niacin ER 1000 MG Oral Tablet Extended Release; Therapy: (Recorded:05Feb2013) to  Allergies Medication  1. No Known Drug Allergies  Family History Problems  1. Family history of  Family Health Status - Mother's Age 21. Family history of  Family Health Status  Number Of Children 1 son; 1 daughter 3. Family history of  No Significant Family History  Social History Problems  1. Alcohol Use 1 per week 2. Family history of  Death In The Family Father ?, cerebral aneurism 3. Marital History - Currently Married 4. Never A Smoker 5. 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 History of  Caffeine Use  Review of Systems Constitutional, skin, eye, otolaryngeal, hematologic/lymphatic, cardiovascular, pulmonary, endocrine, musculoskeletal, gastrointestinal, neurological and psychiatric system(s) were reviewed and pertinent findings if present are noted.  Genitourinary: nocturia and hematuria.    Vitals Vital Signs [Data Includes: Last 1 Day]  06Mar2013 11:02AM  Blood Pressure: 137 / 92 Temperature: 97.6 F Heart Rate: 56  Physical Exam Constitutional: Well nourished and well developed . No acute distress.  ENT:. The ears and nose are normal in appearance.  Pulmonary: No respiratory distress and normal respiratory rhythm and effort.  Abdomen: The abdomen is soft and nontender. No masses are palpated. No CVA tenderness. No hernias are palpable. No hepatosplenomegaly noted.  Genitourinary: Examination of the penis demonstrates no discharge, no masses, no lesions and a normal meatus. The penis is circumcised. The scrotum is normal in appearance and without lesions. Examination of the right scrotum demonstrates no mass. Examination of the left scrotum demostrates no mass. The right epididymis is palpably normal and non-tender. The left epididymis is palpably normal and non-tender. The right testis is palpably normal, non-tender and without masses. The left testis is normal, non-tender and without masses.  Lymphatics: The femoral and inguinal nodes are not enlarged or tender.  Skin: Normal skin  turgor, no visible rash and no visible skin lesions.  Neuro/Psych:. Mood and affect are appropriate.    Results/Data Urine [Data Includes: Last 1 Day]    06Mar2013  COLOR YELLOW   APPEARANCE CLEAR   SPECIFIC GRAVITY 1.025   pH 6.0   GLUCOSE NEG mg/dL  BILIRUBIN NEG   KETONE NEG mg/dL  BLOOD SMALL   PROTEIN NEG mg/dL  UROBILINOGEN 0.2 mg/dL  NITRITE NEG   LEUKOCYTE ESTERASE NEG   SQUAMOUS EPITHELIAL/HPF RARE   WBC NONE SEEN WBC/hpf  RBC 0-3 RBC/hpf  BACTERIA NONE SEEN   CRYSTALS NONE SEEN   CASTS NONE SEEN   Selected Results  AU CT-HEMATURIA PROTOCOL 12Feb2013 12:00AM Jethro Bolus   Test Name Result Flag Reference  ** RADIOLOGY REPORT BY [facility] ** ORIGINAL APPROVED BY: Consuello Bossier, M.D. ON: 10/06/2011 11:02:17   *RADIOLOGY REPORT*  Clinical Data: Gross hematuria. November 2012. No history of renal stones or abdominal complaints. Prostatic dysplasia.  CT ABDOMEN AND PELVIS WITHOUT AND WITH CONTRAST  Technique: Multidetector CT imaging of the abdomen and pelvis was performed without contrast material in one or both body regions, followed by contrast material(s) and further sections in one or both body regions.  Contrast: 125 ml Isovue 300  Comparison: None.  Findings: Unenhanced images demonstrate punctate lower pole right renal calculus. No hydroureter or ureteric calculi. A left sided bladder base stone, which measures 1.9 cm.  Post contrast images demonstrate clear lung bases. Normal heart size without pericardial or pleural effusion. Probable right coronary artery atherosclerosis, suboptimally imaged. A tiny hiatal hernia.  Indeterminate right hepatic dome 7 mm hypoattenuating lesion, too small to characterize. Normal spleen, distal stomach, pancreas, gallbladder, biliary tract, adrenal glands.  5 mm lower pole left renal lesion, most likely a cyst. Too small to characterize upper pole left renal lesion as well.  Left-sided renal sinus cysts.  Delayed images demonstrate moderate renal collecting system opacification bilaterally. The distal-most ureters are incompletely  opacified. No filling defects identified within the opacified ureters.  No retroperitoneal or retrocrural adenopathy.  Redundant cecum, positioned within the anterior pelvis. Normal terminal ileum. Lack of visualization of the appendix.  Normal small bowel without abdominal ascites.   No pelvic adenopathy. Normal prostate, without significant free pelvic fluid. No other bladder filling defects.  No acute osseous abnormality.  IMPRESSION:  1. 1.9 cm left bladder stone. 2. Punctate right renal calculus.  3. Tiny hiatal hernia.   Procedure  Procedure: Cystoscopy  Indication: Hematuria.  Informed Consent: Risks, benefits, and potential adverse events were discussed and informed consent was obtained from the patient.  Prep: The patient was prepped with betadine.  Anesthesia:. Local anesthesia was administered intraurethrally with 2% lidocaine jelly.  Antibiotic prophylaxis: Ciprofloxacin.  Procedure Note:  Urethral meatus:. No abnormalities.  Anterior urethra: No abnormalities.  Prostatic urethra:. The lateral prostatic lobes were enlarged. No intravesical median lobe was visualized.  Bladder: Visulization was clear. The ureteral orifices were in the normal anatomic position bilaterally. The mucosa was smooth without abnormalities. Examination of the bladder demonstrated no clot within the bladder, no trabeculation and no diverticulum no erythematous mucosa, no edema and no cellules. A single  a stone was present in the bladder measuring approximately 1.5 cm in size. 1.5cm bladder stone. The patient tolerated the procedure well.  Complications: None.    Assessment Assessed  1. Bladder Calculus 594.1 2. Gross Hematuria 599.71   1.5cm bladder stone. Needs Ho. Laser and removal.   Plan Health Maintenance (V70.0)  1. UA With REFLEX  Done: 06Mar2013 10:47AM   Holmium laser and bladder stone cystolitholopexy.  Continue finesteride   Quarry manager signed by :  Jethro Bolus, M.D.

## 2011-11-09 ENCOUNTER — Encounter (HOSPITAL_COMMUNITY): Payer: Self-pay

## 2011-11-09 ENCOUNTER — Ambulatory Visit (HOSPITAL_COMMUNITY): Payer: 59 | Admitting: Anesthesiology

## 2011-11-09 ENCOUNTER — Emergency Department (HOSPITAL_COMMUNITY)
Admission: EM | Admit: 2011-11-09 | Discharge: 2011-11-09 | Disposition: A | Discharge status: Home / Self Care | Payer: 59 | Attending: Emergency Medicine | Admitting: Emergency Medicine

## 2011-11-09 ENCOUNTER — Encounter (HOSPITAL_COMMUNITY): Payer: Self-pay | Admitting: Anesthesiology

## 2011-11-09 ENCOUNTER — Ambulatory Visit (HOSPITAL_COMMUNITY)
Admission: RE | Admit: 2011-11-09 | Discharge: 2011-11-09 | Disposition: A | Discharge status: Home / Self Care | Payer: 59 | Source: Ambulatory Visit | Attending: Urology | Admitting: Urology

## 2011-11-09 ENCOUNTER — Encounter (HOSPITAL_COMMUNITY): Payer: Self-pay | Admitting: *Deleted

## 2011-11-09 ENCOUNTER — Encounter (HOSPITAL_COMMUNITY)
Admission: RE | Disposition: A | Discharge status: Home / Self Care | Payer: Self-pay | Source: Ambulatory Visit | Attending: Urology

## 2011-11-09 DIAGNOSIS — E7800 Pure hypercholesterolemia, unspecified: Secondary | ICD-10-CM | POA: Insufficient documentation

## 2011-11-09 DIAGNOSIS — N423 Unspecified dysplasia of prostate: Secondary | ICD-10-CM | POA: Insufficient documentation

## 2011-11-09 DIAGNOSIS — R351 Nocturia: Secondary | ICD-10-CM | POA: Insufficient documentation

## 2011-11-09 DIAGNOSIS — R972 Elevated prostate specific antigen [PSA]: Secondary | ICD-10-CM | POA: Insufficient documentation

## 2011-11-09 DIAGNOSIS — R82998 Other abnormal findings in urine: Secondary | ICD-10-CM | POA: Insufficient documentation

## 2011-11-09 DIAGNOSIS — N210 Calculus in bladder: Secondary | ICD-10-CM | POA: Insufficient documentation

## 2011-11-09 DIAGNOSIS — R300 Dysuria: Secondary | ICD-10-CM | POA: Insufficient documentation

## 2011-11-09 DIAGNOSIS — R319 Hematuria, unspecified: Secondary | ICD-10-CM | POA: Insufficient documentation

## 2011-11-09 DIAGNOSIS — E0590 Thyrotoxicosis, unspecified without thyrotoxic crisis or storm: Secondary | ICD-10-CM | POA: Insufficient documentation

## 2011-11-09 DIAGNOSIS — I10 Essential (primary) hypertension: Secondary | ICD-10-CM | POA: Insufficient documentation

## 2011-11-09 DIAGNOSIS — Z79899 Other long term (current) drug therapy: Secondary | ICD-10-CM | POA: Insufficient documentation

## 2011-11-09 DIAGNOSIS — R339 Retention of urine, unspecified: Secondary | ICD-10-CM | POA: Insufficient documentation

## 2011-11-09 DIAGNOSIS — R310 Gross hematuria: Secondary | ICD-10-CM | POA: Insufficient documentation

## 2011-11-09 DIAGNOSIS — R39198 Other difficulties with micturition: Secondary | ICD-10-CM | POA: Insufficient documentation

## 2011-11-09 DIAGNOSIS — R109 Unspecified abdominal pain: Secondary | ICD-10-CM | POA: Insufficient documentation

## 2011-11-09 DIAGNOSIS — R8271 Bacteriuria: Secondary | ICD-10-CM

## 2011-11-09 HISTORY — PX: CYSTOSCOPY: SHX5120

## 2011-11-09 LAB — URINALYSIS, ROUTINE W REFLEX MICROSCOPIC
Nitrite: NEGATIVE
Protein, ur: 100 mg/dL — AB
Urobilinogen, UA: 0.2 mg/dL (ref 0.0–1.0)

## 2011-11-09 LAB — POCT I-STAT, CHEM 8
BUN: 20 mg/dL (ref 6–23)
Calcium, Ion: 1.19 mmol/L (ref 1.12–1.32)
Creatinine, Ser: 1.1 mg/dL (ref 0.50–1.35)
Glucose, Bld: 153 mg/dL — ABNORMAL HIGH (ref 70–99)
Hemoglobin: 15.3 g/dL (ref 13.0–17.0)
Sodium: 135 mEq/L (ref 135–145)
TCO2: 21 mmol/L (ref 0–100)

## 2011-11-09 LAB — URINE MICROSCOPIC-ADD ON

## 2011-11-09 SURGERY — CYSTOSCOPY
Anesthesia: General | Wound class: Clean Contaminated

## 2011-11-09 MED ORDER — PROMETHAZINE HCL 25 MG/ML IJ SOLN: 6.2500 mg | INTRAMUSCULAR | Status: DC | PRN

## 2011-11-09 MED ORDER — LACTATED RINGERS IV SOLN
INTRAVENOUS | Status: DC
  Administered 2011-11-09: 1000 mL via INTRAVENOUS

## 2011-11-09 MED ORDER — FENTANYL CITRATE 0.05 MG/ML IJ SOLN: 25.0000 ug | INTRAMUSCULAR | Status: DC | PRN

## 2011-11-09 MED ORDER — PHENYLEPHRINE HCL 10 MG/ML IJ SOLN
INTRAMUSCULAR | Status: DC | PRN
  Administered 2011-11-09 (×2): 40 ug via INTRAVENOUS

## 2011-11-09 MED ORDER — LACTATED RINGERS IV SOLN
INTRAVENOUS | Status: DC | PRN
  Administered 2011-11-09: 10:00:00 via INTRAVENOUS

## 2011-11-09 MED ORDER — HYDROCODONE-ACETAMINOPHEN 7.5-650 MG PO TABS: 1.0000 | ORAL_TABLET | Freq: Four times a day (QID) | ORAL | Status: AC | PRN

## 2011-11-09 MED ORDER — BELLADONNA ALKALOIDS-OPIUM 16.2-60 MG RE SUPP
RECTAL | Status: DC | PRN
  Administered 2011-11-09: 1 via RECTAL

## 2011-11-09 MED ORDER — CEFAZOLIN SODIUM 1-5 GM-% IV SOLN: 1.0000 g | INTRAVENOUS | Status: DC

## 2011-11-09 MED ORDER — SCOPOLAMINE 1 MG/3DAYS TD PT72
MEDICATED_PATCH | TRANSDERMAL | Status: DC | PRN
  Administered 2011-11-09: 1 via TRANSDERMAL

## 2011-11-09 MED ORDER — PROPOFOL 10 MG/ML IV BOLUS
INTRAVENOUS | Status: DC | PRN
  Administered 2011-11-09: 200 mg via INTRAVENOUS

## 2011-11-09 MED ORDER — KETOROLAC TROMETHAMINE 30 MG/ML IJ SOLN
INTRAMUSCULAR | Status: DC | PRN
  Administered 2011-11-09: 30 mg via INTRAVENOUS

## 2011-11-09 MED ORDER — TAMSULOSIN HCL 0.4 MG PO CAPS: 0.4000 mg | ORAL_CAPSULE | Freq: Every day | ORAL | Status: DC

## 2011-11-09 MED ORDER — DEXAMETHASONE SODIUM PHOSPHATE 10 MG/ML IJ SOLN
INTRAMUSCULAR | Status: DC | PRN
  Administered 2011-11-09: 10 mg via INTRAVENOUS

## 2011-11-09 MED ORDER — SCOPOLAMINE 1 MG/3DAYS TD PT72
MEDICATED_PATCH | TRANSDERMAL | Status: AC
  Filled 2011-11-09: qty 1

## 2011-11-09 MED ORDER — ACETAMINOPHEN 10 MG/ML IV SOLN
INTRAVENOUS | Status: AC
  Filled 2011-11-09: qty 100

## 2011-11-09 MED ORDER — EPHEDRINE SULFATE 50 MG/ML IJ SOLN
INTRAMUSCULAR | Status: DC | PRN
  Administered 2011-11-09: 5 mg via INTRAVENOUS
  Administered 2011-11-09: 15 mg via INTRAVENOUS
  Administered 2011-11-09 (×6): 5 mg via INTRAVENOUS

## 2011-11-09 MED ORDER — ACETAMINOPHEN 10 MG/ML IV SOLN
1000.0000 mg | Freq: Four times a day (QID) | INTRAVENOUS | Status: DC
  Filled 2011-11-09: qty 100

## 2011-11-09 MED ORDER — MIDAZOLAM HCL 5 MG/5ML IJ SOLN
INTRAMUSCULAR | Status: DC | PRN
  Administered 2011-11-09: 2 mg via INTRAVENOUS

## 2011-11-09 MED ORDER — CEFAZOLIN SODIUM 1-5 GM-% IV SOLN
INTRAVENOUS | Status: AC
  Filled 2011-11-09: qty 50

## 2011-11-09 MED ORDER — BELLADONNA ALKALOIDS-OPIUM 16.2-60 MG RE SUPP
RECTAL | Status: AC
  Filled 2011-11-09: qty 1

## 2011-11-09 MED ORDER — ONDANSETRON HCL 4 MG/2ML IJ SOLN
INTRAMUSCULAR | Status: DC | PRN
  Administered 2011-11-09: 4 mg via INTRAVENOUS

## 2011-11-09 MED ORDER — URELLE 81 MG PO TABS: 1.0000 | ORAL_TABLET | Freq: Three times a day (TID) | ORAL | Status: DC

## 2011-11-09 MED ORDER — FENTANYL CITRATE 0.05 MG/ML IJ SOLN
INTRAMUSCULAR | Status: DC | PRN
  Administered 2011-11-09: 50 ug via INTRAVENOUS
  Administered 2011-11-09: 100 ug via INTRAVENOUS

## 2011-11-09 MED ORDER — HYDROCODONE-ACETAMINOPHEN 7.5-650 MG PO TABS: 1.0000 | ORAL_TABLET | Freq: Four times a day (QID) | ORAL | Status: DC | PRN

## 2011-11-09 MED ORDER — KETOROLAC TROMETHAMINE 30 MG/ML IJ SOLN: 15.0000 mg | Freq: Once | INTRAMUSCULAR | Status: DC | PRN

## 2011-11-09 MED ORDER — SODIUM CHLORIDE 0.9 % IR SOLN
Status: DC | PRN
  Administered 2011-11-09: 3000 mL

## 2011-11-09 MED ORDER — LIDOCAINE HCL (CARDIAC) 20 MG/ML IV SOLN
INTRAVENOUS | Status: DC | PRN
  Administered 2011-11-09: 50 mg via INTRAVENOUS

## 2011-11-09 MED ORDER — METOCLOPRAMIDE HCL 5 MG/ML IJ SOLN
INTRAMUSCULAR | Status: DC | PRN
  Administered 2011-11-09: 10 mg via INTRAVENOUS

## 2011-11-09 MED ORDER — TRIMETHOPRIM 100 MG PO TABS: 100.0000 mg | ORAL_TABLET | ORAL | Status: AC

## 2011-11-09 MED ORDER — DROPERIDOL 2.5 MG/ML IJ SOLN
INTRAMUSCULAR | Status: DC | PRN
  Administered 2011-11-09: 0.625 mg via INTRAVENOUS

## 2011-11-09 MED ORDER — LIDOCAINE HCL 2 % EX GEL
CUTANEOUS | Status: AC
  Filled 2011-11-09: qty 10

## 2011-11-09 MED ORDER — LIDOCAINE HCL 2 % EX GEL
CUTANEOUS | Status: DC | PRN
  Administered 2011-11-09: 1 via URETHRAL

## 2011-11-09 SURGICAL SUPPLY — 17 items
BAG URO CATCHER STRL LF (DRAPE) ×2 IMPLANT
CATH ROBINSON RED A/P 16FR (CATHETERS) IMPLANT
CATH URET 5FR 28IN OPEN ENDED (CATHETERS) ×1 IMPLANT
CLOTH BEACON ORANGE TIMEOUT ST (SAFETY) ×2 IMPLANT
DRAPE CAMERA CLOSED 9X96 (DRAPES) ×2 IMPLANT
GLOVE BIOGEL M STRL SZ7.5 (GLOVE) ×2 IMPLANT
GLOVE SURG SS PI 8.0 STRL IVOR (GLOVE) ×2 IMPLANT
GOWN PREVENTION PLUS XLARGE (GOWN DISPOSABLE) ×2 IMPLANT
GOWN STRL NON-REIN LRG LVL3 (GOWN DISPOSABLE) ×2 IMPLANT
GOWN STRL REIN XL XLG (GOWN DISPOSABLE) ×2 IMPLANT
IV NS IRRIG 3000ML ARTHROMATIC (IV SOLUTION) ×2 IMPLANT
LASER FIBER DISP 1000U (UROLOGICAL SUPPLIES) ×1 IMPLANT
MANIFOLD NEPTUNE II (INSTRUMENTS) ×2 IMPLANT
MARKER SKIN DUAL TIP RULER LAB (MISCELLANEOUS) ×1 IMPLANT
PACK CYSTO (CUSTOM PROCEDURE TRAY) ×2 IMPLANT
SYRINGE IRR TOOMEY STRL 70CC (SYRINGE) ×1 IMPLANT
TUBING CONNECTING 10 (TUBING) ×2 IMPLANT

## 2011-11-09 NOTE — Anesthesia Preprocedure Evaluation (Signed)
Anesthesia Evaluation  Patient identified by MRN, date of birth, ID band Patient awake    Reviewed: Allergy & Precautions, H&P , NPO status , Patient's Chart, lab work & pertinent test results  Airway Mallampati: II TM Distance: >3 FB Neck ROM: Full    Dental No notable dental hx.    Pulmonary neg pulmonary ROS,  breath sounds clear to auscultation  Pulmonary exam normal       Cardiovascular hypertension, Rhythm:Regular Rate:Normal     Neuro/Psych negative neurological ROS  negative psych ROS   GI/Hepatic negative GI ROS, Neg liver ROS,   Endo/Other  Hypothyroidism   Renal/GU negative Renal ROS  negative genitourinary   Musculoskeletal negative musculoskeletal ROS (+)   Abdominal   Peds negative pediatric ROS (+)  Hematology negative hematology ROS (+)   Anesthesia Other Findings   Reproductive/Obstetrics negative OB ROS                           Anesthesia Physical Anesthesia Plan  ASA: II  Anesthesia Plan: General   Post-op Pain Management:    Induction: Intravenous  Airway Management Planned: LMA  Additional Equipment:   Intra-op Plan:   Post-operative Plan:   Informed Consent: I have reviewed the patients History and Physical, chart, labs and discussed the procedure including the risks, benefits and alternatives for the proposed anesthesia with the patient or authorized representative who has indicated his/her understanding and acceptance.   Dental advisory given  Plan Discussed with: CRNA  Anesthesia Plan Comments:         Anesthesia Quick Evaluation

## 2011-11-09 NOTE — Discharge Instructions (Signed)
Acute Urinary Retention, Male You have been seen by a caregiver today because of your inability to urinate (pass your water). This is a common problem in elderly males. As men age their prostates become larger and block the flow of urine from the bladder. This is usually a problem that has come on gradually. It is often first noticed by having to get up at night to urinate. This is because as the prostate enlarges it is more difficult to empty the bladder completely. Treatment may involve a one time catheterization to empty the bladder. This is putting in a tube to drain your urine. Then you and your personal caregiver can decide at your earliest convenience how to handle this problem in the future. It may also be a problem that may not recur for years. Sometimes this problem can be caused by medications. In this case, all that is often necessary is to discontinue the offending agent. If you are to leave the foley catheter (a long, narrow, hollow tube) in and go home with a drainage system, you will need to discuss the best course of action with your caregiver. While the catheter is in, maintain a good intake of fluids. Keep the drainage bag emptied and lower than your catheter. This is so contaminated (infected) urine will not be flowing back into your bladder. This could lead to a urinary tract infection. Only take over-the-counter or prescription medicines for pain, discomfort, or fever as directed by your caregiver.  SEEK IMMEDIATE MEDICAL CARE IF:  You develop chills, fever, or show signs of generalized illness that occurs prior to seeing your caregiver. Document Released: 11/16/2000 Document Revised: 07/30/2011 Document Reviewed: 08/01/2008 Exit[employer] Patient Information 2012 [employer]. 

## 2011-11-09 NOTE — ED Notes (Signed)
Pt states had a bladder stone ablation done today, hasn't urinated since 1430, c/o severe pain and pressure to lower abd area.

## 2011-11-09 NOTE — Anesthesia Postprocedure Evaluation (Signed)
  Anesthesia Post-op Note  Patient: Richard Ibarra  Procedure(s) Performed: Procedure(s) (LRB): CYSTOSCOPY (N/A) HOLMIUM LASER APPLICATION (N/A)  Patient Location: PACU  Anesthesia Type: General  Level of Consciousness: awake and alert   Airway and Oxygen Therapy: Patient Spontanous Breathing  Post-op Pain: mild  Post-op Assessment: Post-op Vital signs reviewed, Patient's Cardiovascular Status Stable, Respiratory Function Stable, Patent Airway and No signs of Nausea or vomiting  Post-op Vital Signs: stable  Complications: No apparent anesthesia complications

## 2011-11-09 NOTE — Interval H&P Note (Signed)
History and Physical Interval Note:  11/09/2011 9:06 AM  Richard Ibarra  has presented today for surgery, with the diagnosis of bladder stone  The various methods of treatment have been discussed with the patient and family. After consideration of risks, benefits and other options for treatment, the patient has consented to  Procedure(s) (LRB): CYSTOSCOPY (N/A) HOLMIUM LASER APPLICATION (N/A) as a surgical intervention .  The patients' history has been reviewed, patient examined, no change in status, stable for surgery.  I have reviewed the patients' chart and labs.  Questions were answered to the patient's satisfaction.     [name]

## 2011-11-09 NOTE — Discharge Instructions (Signed)
Hematuria, Adult Hematuria (blood in your urine) can be caused by a bladder infection (cystitis), kidney infection (pyelonephritis), prostate infection (prostatitis), or kidney stone. Infections will usually respond to antibiotics (medications which kill germs), and a kidney stone will usually pass through your urine without further treatment. If you were put on antibiotics, take all the medicine until gone. You may feel better in a few days, but take all of your medicine or the infection may not respond and become more difficult to treat. If antibiotics were not given, an infection did not cause the blood in the urine. A further work up to find out the reason may be needed. HOME CARE INSTRUCTIONS   Drink lots of fluid, 3 to 4 quarts a day. If you have been diagnosed with an infection, cranberry juice is especially recommended, in addition to large amounts of water.   Avoid caffeine, tea, and carbonated beverages, because they tend to irritate the bladder.   Avoid alcohol as it may irritate the prostate.   Only take over-the-counter or prescription medicines for pain, discomfort, or fever as directed by your caregiver.   If you have been diagnosed with a kidney stone follow your caregivers instructions regarding straining your urine to catch the stone.  TO PREVENT FURTHER INFECTIONS:  Empty the bladder often. Avoid holding urine for long periods of time.   After a bowel movement, women should cleanse front to back. Use each tissue only once.   Empty the bladder before and after sexual intercourse if you are a male.   Return to your caregiver if you develop back pain, fever, nausea (feeling sick to your stomach), vomiting, or your symptoms (problems) are not better in 3 days. Return sooner if you are getting worse.  If you have been requested to return for further testing make sure to keep your appointments. If an infection is not the cause of blood in your urine, X-rays may be required. Your  caregiver will discuss this with you. SEEK IMMEDIATE MEDICAL CARE IF:   You have a persistent fever over 102 F (38.9 C).   You develop severe vomiting and are unable to keep the medication down.   You develop severe back or abdominal pain despite taking your medications.   You begin passing a large amount of blood or clots in your urine.   You feel extremely weak or faint, or pass out.  MAKE SURE YOU:   Understand these instructions.   Will watch your condition.   Will get help right away if you are not doing well or get worse.  Document Released: 08/10/2005 Document Revised: 07/30/2011 Document Reviewed: 03/29/2008 ExitCare Patient Information 2012 [employer].in an in and a Benign Prostatic Hypertrophy  The prostate gland is part of the reproductive system of men. A normal prostate is about the size and shape of a walnut. The prostate gland makes a fluid that is mixed with sperm to make semen. This gland surrounds the urethra and is located in front of the rectum and just below the bladder. The bladder is where urine is stored. The urethra is the tube through which urine passes from the bladder to get out of the body. The prostate grows as a man ages. An enlarged prostate not caused by cancer is called benign prostatic hypertrophy (BPH). This is a common health problem in men over age 64. This condition is a normal part of aging. An enlarged prostate presses on the urethra. This makes it harder to pass urine. In the  early stages of enlargement, the bladder can get by with a narrowed urethra by forcing the urine through. If the problem gets worse, medical or surgical treatment may be required.  This condition should be followed by your caregiver. Longstanding back pressure on the kidneys can cause infection. Back pressure and infection can progress to bladder damage and kidney (renal) failure. If needed, your caregiver may refer you to a specialist in kidney and prostate disease  (urologist). CAUSES  The exact cause is not known.  SYMPTOMS   You are not able to completely empty your bladder.   Getting up often during the night to urinate.   Need to urinate frequently during the day.   Difficultly in starting urine flow.   Decrease in size and strength of the urine stream.   Dribbling after urination.   Pain on urination (more common with infection).   Inability to pass your water. This needs immediate treatment.  DIAGNOSIS  These tests will help your caregiver understand your problem:  Digital rectal exam (DRE). In a rectal exam, your caregiver checks your prostate by putting a gloved, lubricated finger into the rectum to feel the back of your prostate gland. This exam detects the size of the gland and abnormal lumps or growths.   Urinalysis (exam of the urine). This may include a culture if there is concern about infection.   Prostate Specific Antigen (PSA). This is a blood test used to screen for prostate cancer. It is not used alone for diagnosing prostate cancer.   Rectal ultrasound (sonogram). This test uses sound waves to electronically produce a "picture" of the prostate. It helps examine the prostate gland for cancer.  TREATMENT  Mild symptoms may not need treatment. Simple observation and yearly exams may be all that is required. Medications and surgery are options for more severe problems. Your caregiver can help you make an informed decision for what is best. Two classes of medications are available for relief of prostate symptoms:  Medications that shrink the prostate. This helps relieve symptoms.   Uncommon side effects include problems with sexual function.   Medications to relax the muscle of the prostate. This also relieves the obstruction.   Side effects can include dizziness, fatigue, lightheadedness, and retrograde ejaculation (diminished volume of ejaculate).  Several types of surgical treatments are available for relief of  prostate symptoms:  Transurethral resection of the prostate (TURP). In this treatment, an instrument is inserted through opening at the tip of the penis. It is used to cut away pieces of the inner core of the prostate. The pieces are removed through the same opening of the penis. This removes the obstruction and helps get rid of the symptoms.   Transurethral incision (TUIP). In this procedure, small cuts are made in the prostate. This lessens the prostates pressure on the urethra.   Transurethral microwave thermotherapy (TUMT). This procedure uses microwaves to create heat. The heat destroys and removes a small amount of prostate tissue.   Transurethral needle ablation (TUNA). This is a procedure that uses radio frequencies to do the same as TUMT.   Interstitial laser coagulation (ILC). This is a procedure that uses a laser to do the same as TUMT and TUNA.   Transurethral electrovaporization (TUVP). This is a procedure that uses electrodes to do the same as the procedures listed above.  Regardless of the method of treatment chosen, you and your caregiver will discuss the options. With this knowledge, you along with your caregiver can decide upon  the best treatment for you. SEEK MEDICAL CARE IF:   You develop chills, fever of 100.5 F (38.1 C), or night sweats.   There is unexplained back pain.   Symptoms are not helped by medications prescribed.   You develop medication side effects.   Your urine becomes very dark or has a bad smell.  SEEK IMMEDIATE MEDICAL CARE IF:   You are suddenly unable to urinate. This is an emergency. You should be seen immediately.   There are large amounts of blood or clots in the urine.   Your urinary problems become unmanageable.   You develop lightheadedness, severe dizziness, or you feel faint.   You develop moderate to severe low back or flank pain.   You develop chills or fever.  Document Released: 08/10/2005 Document Revised: 07/30/2011 Document  Reviewed: 05/02/2007 [facility] Patient Information 2012 [place], [place].Cystoscopy (Bladder Exam) Care After Refer to this sheet in the next few weeks. These discharge instructions provide you with general information on caring for yourself after you leave the hospital. Your caregiver may also give you specific instructions. Your treatment has been planned according to the most current medical practices available, but unavoidable complications sometimes occur. If you have any problems or questions after discharge, please call your caregiver. AFTER THE PROCEDURE   There may be temporary bleeding and burning with urination.   Drink enough water and fluids to keep your urine clear or pale yellow.  FINDING OUT THE RESULTS OF YOUR TEST Not all test results are available during your visit. If your test results are not back during the visit, make an appointment with your caregiver to find out the results. Do not assume everything is normal if you have not heard from your caregiver or the medical facility. It is important for you to follow up on all of your test results. SEEK IMMEDIATE MEDICAL CARE IF:   There is an increase in blood in the urine or you are passing clots.   There is difficulty passing urine.   You develop the chills.   You have an oral temperature above 102 F (38.9 C), not controlled by medicine.   Belly (abdominal) pain develops.  Document Released: 02/27/2005 Document Revised: 07/30/2011 Document Reviewed: 12/26/2007 [facility] Patient Information 2012 [place], [place].

## 2011-11-09 NOTE — Transfer of Care (Signed)
Immediate Anesthesia Transfer of Care Note  Patient: Richard Ibarra  Procedure(s) Performed: Procedure(s) (LRB): CYSTOSCOPY (N/A) HOLMIUM LASER APPLICATION (N/A)  Patient Location: PACU  Anesthesia Type: General  Level of Consciousness: awake, alert , oriented, patient cooperative and responds to stimulation  Airway & Oxygen Therapy: Patient Spontanous Breathing and Patient connected to face mask oxygen  Post-op Assessment: Report given to PACU RN, Post -op Vital signs reviewed and stable and Patient moving all extremities  Post vital signs: Reviewed and stable  Complications: No apparent anesthesia complications

## 2011-11-09 NOTE — ED Notes (Signed)
Pt left for home without waiting for discharge instructions.

## 2011-11-09 NOTE — ED Provider Notes (Signed)
History     CSN: [id number]  Arrival date & time 11/09/11  1746   First MD Initiated Contact with Patient 11/09/11 1939      Chief Complaint  Patient presents with  . Dysuria    hasn't urinated since 1430    (Consider location/radiation/quality/duration/timing/severity/associated sxs/prior treatment) HPI  [id number] h/o bladder stone s/p cystoscopy and litholapaxy today pw urinary retention. Patient states that he had normal urine stream shortly after the procedure. His last void was 2:30 PM. He states that since that time he felt slowly mounting pressure in the suprapubic area. He rates the pain as 9/10 prior to Foley placement today. He denies dysuria, he's had some hematuria since the procedure. Denies fever, chills, nausea, vomiting.  Per OP note:   Pre-operative diagnosis : Bladder stone  Postoperative diagnosis: same  Operation: Cystoscopy and litholapaxy with the holmium laser  Surgeon: S. Patsi Sears, MD  Anesthesia: general LMA  Review history: 67 year old male, with a history of hematuria, with CT scan showing bladder stone. Cystoscopy also identified bladder stone, the bladder neck. The patient has BPH symptoms, and is voiding well on medication therapy. He is now for cystoscopy litholapaxy. He is wanted to avoid operative intervention because of fear of retrograde ejaculation.   Past Medical History  Diagnosis Date  . Allergy   . Hypertension   . Thyroid disease   . Seasonal allergies 11-02-11    Tx. Allegra daily  . Gross hematuria 10-30-11    story consistent with exercise induced, eval by uro with 1.9cm stone L kidney    Past Surgical History  Procedure Date  . Rash testing 02/17/1989    multiple positives  . Prostate biopsy 01/29/2006    Dr. Patsi Sears    Family History  Problem Relation Age of Onset  . Cerebral aneurysm Father     History  Substance Use Topics  . Smoking status: Never Smoker   . Smokeless tobacco: Not on file  . Alcohol Use: Not on  file      Review of Systems  All other systems reviewed and are negative.   except as noted HPI   Allergies  Banana and Food  Home Medications   Current Outpatient Rx  Name Route Sig Dispense Refill  . FEXOFENADINE HCL 180 MG PO TABS Oral Take 1 tablet (180 mg total) by mouth daily. 90 tablet 3  . FINASTERIDE 5 MG PO TABS Oral Take 1 tablet (5 mg total) by mouth daily. 90 tablet 3  . IBUPROFEN 200 MG PO TABS Oral Take 400 mg by mouth every 6 (six) hours as needed. PAIN    . LEVOTHYROXINE SODIUM 112 MCG PO TABS  Take one by mouth daily 90 tablet 3  . NIACIN ER (ANTIHYPERLIPIDEMIC) 1000 MG PO TBCR Oral Take 1 tablet (1,000 mg total) by mouth at bedtime. 90 tablet 3  . OLMESARTAN MEDOXOMIL 20 MG PO TABS Oral Take 1 tablet (20 mg total) by mouth at bedtime. 90 tablet 3  . ROSUVASTATIN CALCIUM 10 MG PO TABS Oral Take 1 tablet (10 mg total) by mouth at bedtime. 90 tablet 0    Patient must make appt for further refills.  Marland Kitchen HYDROCODONE-ACETAMINOPHEN 7.5-650 MG PO TABS Oral Take 1 tablet by mouth every 6 (six) hours as needed for pain. 30 tablet 0  . METOPROLOL TARTRATE 50 MG PO TABS Oral Take 1 tablet (50 mg total) by mouth daily before breakfast. 90 tablet 3  . TAMSULOSIN HCL 0.4 MG PO CAPS Oral Take  1 capsule (0.4 mg total) by mouth daily. 10 capsule 0  . TRIMETHOPRIM 100 MG PO TABS Oral Take 1 tablet (100 mg total) by mouth 1 day or 1 dose. 30 tablet 1  . TRIMETHOPRIM 100 MG PO TABS Oral Take 1 tablet (100 mg total) by mouth 1 day or 1 dose. 30 tablet 1  . URELLE 81 MG PO TABS Oral Take 1 tablet (81 mg total) by mouth 3 (three) times daily. 30 each 2  . URELLE 81 MG PO TABS Oral Take 1 tablet (81 mg total) by mouth 3 (three) times daily. 30 each 2    BP 139/65  Pulse 90  Temp(Src) 98.6 F (37 C) (Oral)  Resp 25  SpO2 98%  Physical Exam  Nursing note and vitals reviewed. Constitutional: He is oriented to person, place, and time. He appears well-developed and well-nourished. No  distress.  HENT:  Head: Atraumatic.  Mouth/Throat: Oropharynx is clear and moist.  Eyes: Conjunctivae are normal. Pupils are equal, round, and reactive to light.  Neck: Neck supple.  Cardiovascular: Normal rate, regular rhythm, normal heart sounds and intact distal pulses.  Exam reveals no gallop and no friction rub.   No murmur heard. Pulmonary/Chest: Effort normal. No respiratory distress. He has no wheezes. He has no rales.  Abdominal: Soft. Bowel sounds are normal. There is no tenderness. There is no rebound and no guarding.  Musculoskeletal: Normal range of motion. He exhibits no edema and no tenderness.  Neurological: He is alert and oriented to person, place, and time.  Skin: Skin is warm and dry.  Psychiatric: He has a normal mood and affect.    ED Course  Procedures (including critical care time)  Labs Reviewed  URINALYSIS, ROUTINE W REFLEX MICROSCOPIC - Abnormal; Notable for the following:    Color, Urine RED (*) BIOCHEMICALS MAY BE AFFECTED BY COLOR   APPearance CLOUDY (*)    Hgb urine dipstick LARGE (*)    Ketones, ur TRACE (*)    Protein, ur 100 (*)    Leukocytes, UA SMALL (*)    All other components within normal limits  URINE MICROSCOPIC-ADD ON - Abnormal; Notable for the following:    Bacteria, UA FEW (*)    All other components within normal limits  POCT I-STAT, CHEM 8 - Abnormal; Notable for the following:    Glucose, Bld 153 (*)    All other components within normal limits  URINE CULTURE   No results found.   1. Urinary retention   2. Hematuria   3. Bacteria in urine     MDM  S/p uro procedure as above with urinary retention. Foley placed with > 700cc urine out, bloody. Now clear. Cr baseline. D/W urology- home with leg bag. Received IV abx this am. And has prescription. F/U urology tomorrow. Call for appt.  Forbes Cellar, MD 11/11/11 [phone]

## 2011-11-09 NOTE — Preoperative (Signed)
Beta Blockers   Reason not to administer Beta Blockers:Not Applicable 

## 2011-11-09 NOTE — Anesthesia Procedure Notes (Signed)
Procedure Name: LMA Insertion Date/Time: 11/09/2011 10:39 AM Performed by: Randon Goldsmith CATHERINE PAYNE Pre-anesthesia Checklist: Patient identified, Emergency Drugs available, Suction available and Patient being monitored Patient Re-evaluated:Patient Re-evaluated prior to inductionOxygen Delivery Method: Circle system utilized Preoxygenation: Pre-oxygenation with 100% oxygen Intubation Type: IV induction Ventilation: Mask ventilation without difficulty LMA: LMA with gastric port inserted LMA Size: 5.0 Tube type: Oral Number of attempts: 1 Placement Confirmation: positive ETCO2,  CO2 detector and breath sounds checked- equal and bilateral Tube secured with: Tape Dental Injury: Teeth and Oropharynx as per pre-operative assessment

## 2011-11-09 NOTE — ED Notes (Signed)
Pt had ablation performed on stone in bladder today. Was having no issues after procedure but sts after fluids, needed to urinate and could not pass any urine. Had pain/pressure in lower abd/groin. Placed foley catheter upon pts arrival due to inability to pass urine since 1430 today. Pt reports relief of pain and pressure since this. Urine returned is bloody. Awaiting EDP assessment.

## 2011-11-09 NOTE — Op Note (Signed)
Pre-operative diagnosis :  Bladder stone   Postoperative diagnosis: same  Operation: Cystoscopy and litholapaxy with the holmium laser  Surgeon:  S. Patsi Sears, MD  First assistant: None  Anesthesia:  general LMA   Preparation: After appropriate preanesthesia, the patient was brought to the operating room, and placed on the operating table in the dorsal supine position for general LMA anesthesia was introduced. He was then replaced in the dorsal lithotomy position, where the pubis was prepped with Betadine solution, and draped in usual fashion. Armband was rechecked.  Review history: 66 year old male, with a history of hematuria, with CT scan showing bladder stone. Cystoscopy also identified bladder stone, the bladder neck. The patient has BPH symptoms, and is voiding well on medication therapy. He is now for cystoscopy litholapaxy. He is wanted to avoid operative intervention because of fear of retrograde ejaculation.  Statement of  Likelihood of Success: Excellent. TIME-OUT observed.:  Procedure: The 24 cystoscope sheath is placed within the bladder, and the stone was manipulated from the anterior bladder wall behind the median lobe, to the bladder base. Using 1000 water fiber, the patient undergo cystoscopy litholapaxy, using a combination of 0.8 J, and 0.5 J, with a rate of 8 pulses per second, and a power of 6.4 W, and total energy of 4.10 kJ. The patient tolerated the procedure well. The small bladder fragments were irrigated free the bladder and sent to laboratory. Xylocaine jelly was placed urethra. The patient given IV Toradol, at the end of the case, and IV Tylenol from the beginning of the case. He was awakened and taken recovery room in good condition.

## 2011-11-10 LAB — URINE CULTURE: Culture: NO GROWTH

## 2011-11-15 LAB — STONE ANALYSIS: Stone Weight KSTONE: 1.767 g

## 2011-11-18 ENCOUNTER — Encounter (HOSPITAL_COMMUNITY): Payer: Self-pay | Admitting: Urology

## 2011-12-03 ENCOUNTER — Ambulatory Visit: Payer: 59 | Admitting: Family Medicine

## 2011-12-28 ENCOUNTER — Ambulatory Visit (INDEPENDENT_AMBULATORY_CARE_PROVIDER_SITE_OTHER): Payer: 59 | Admitting: Family Medicine

## 2011-12-28 ENCOUNTER — Encounter: Payer: Self-pay | Admitting: Family Medicine

## 2011-12-28 VITALS — BP 134/84 | HR 60 | Temp 97.5°F | Wt 155.0 lb

## 2011-12-28 DIAGNOSIS — Z789 Other specified health status: Secondary | ICD-10-CM | POA: Insufficient documentation

## 2011-12-28 MED ORDER — ATOVAQUONE-PROGUANIL HCL 250-100 MG PO TABS: ORAL_TABLET | ORAL | Status: DC

## 2011-12-28 MED ORDER — CIPROFLOXACIN HCL 500 MG PO TABS: ORAL_TABLET | ORAL | Status: DC

## 2011-12-28 NOTE — Progress Notes (Signed)
Subjective:    Patient ID: Richard Ibarra, male    DOB: Dec 31, 1945, 66 y.o.   MRN: [id number]  HPI  66 yo pleasant male here to discuss medications he needs before going to [place] for work. Made similar trip last year, already received vaccinations for typhoid, hep b, hep a. Needs malaria prophylaxis and traveler's diarrhea prophylaxis.  Could not tolerate doxycycline last time- several nausea and occasional vomiting.  Patient Active Problem List  Diagnoses  . HYPOTHYROIDISM  . HYPERTENSION  . ALLERGIC RHINITIS  . ALLERGY  . Gross hematuria  . Routine general medical examination at a health care facility   Past Medical History  Diagnosis Date  . Allergy   . Hypertension   . Thyroid disease   . Seasonal allergies 11-02-11    Tx. Allegra daily  . Gross hematuria 10-30-11    story consistent with exercise induced, eval by uro with 1.9cm stone L kidney   Past Surgical History  Procedure Date  . Rash testing 02/17/1989    multiple positives  . Prostate biopsy 01/29/2006    Dr. Patsi Sears  . Cystoscopy 11/09/2011    Procedure: CYSTOSCOPY;  Surgeon: Kathi Ludwig, MD;  Location: [facility];  Service: Urology;  Laterality: N/A;   History  Substance Use Topics  . Smoking status: Never Smoker   . Smokeless tobacco: Not on file  . Alcohol Use: Not on file   Family History  Problem Relation Age of Onset  . Cerebral aneurysm Father    Allergies  Allergen Reactions  . Banana Swelling  . Food Swelling    All melons    Current Outpatient Prescriptions on File Prior to Visit  Medication Sig Dispense Refill  . fexofenadine (ALLEGRA) 180 MG tablet Take 1 tablet (180 mg total) by mouth daily.  90 tablet  3  . finasteride (PROSCAR) 5 MG tablet Take 1 tablet (5 mg total) by mouth daily.  90 tablet  3  . ibuprofen (ADVIL,MOTRIN) 200 MG tablet Take 400 mg by mouth every 6 (six) hours as needed. PAIN      . levothyroxine (SYNTHROID, LEVOTHROID) 112 MCG tablet Take one by  mouth daily  90 tablet  3  . metoprolol (LOPRESSOR) 50 MG tablet Take 1 tablet (50 mg total) by mouth daily before breakfast.  90 tablet  3  . niacin (NIASPAN) 1000 MG CR tablet Take 1 tablet (1,000 mg total) by mouth at bedtime.  90 tablet  3  . olmesartan (BENICAR) 20 MG tablet Take 1 tablet (20 mg total) by mouth at bedtime.  90 tablet  3  . rosuvastatin (CRESTOR) 10 MG tablet Take 1 tablet (10 mg total) by mouth at bedtime.  90 tablet  0   Current Facility-Administered Medications on File Prior to Visit  Medication Dose Route Frequency Provider Last Rate Last Dose  . opium-belladonna (B&O SUPPRETTES) suppository 1 suppository  1 suppository Rectal Once Kathi Ludwig, MD       The PMH, PSH, Social History, Family History, Medications, and allergies have been reviewed in [facility], and have been updated if relevant.   Review of Systems See HPI    Objective:   Physical Exam BP 134/84  Pulse 60  Temp(Src) 97.5 F (36.4 C) (Oral)  Wt 155 lb (70.308 kg) General:  Pleasant male in NAD Psych:  Good eye contact, alert, pleasant     Assessment & Plan:   1. Travel foreign    >15 min spent with face to face with  patient, >50% counseling and/or coordinating care. Reviewed and discussed CDC recommendations for travel to [place]. Rx sent for malarone for malaria prophylaxis and cipro for traveler's diarrhea (as needed).

## 2011-12-31 ENCOUNTER — Telehealth: Payer: Self-pay | Admitting: *Deleted

## 2011-12-31 NOTE — Telephone Encounter (Signed)
See note below.  I called urology office and was told by medical records that pt will need to sign a release before any information is shared, I was then told that I could leave a message for the nurse and perhaps she can call back with the information requested.  Message left for Dr. Imelda Pillow nurse to call me back.

## 2011-12-31 NOTE — Telephone Encounter (Signed)
Message copied by Eliezer Bottom on Thu Dec 31, 2011  3:46 PM ------      Message from: Dianne Dun      Created: Mon Dec 28, 2011  7:58 AM       Please call [facility] and request copy of composition of stone.        Thanks,      [employer]

## 2012-01-04 NOTE — Telephone Encounter (Signed)
Left message on voice mail asking pt to call back. 

## 2012-01-04 NOTE — Telephone Encounter (Signed)
Spoke with Richard Ibarra at urology office.  She says the only report they have is the path report stating stone was sent for analysis, they dont have anything since.  That report is under chart review in epic.

## 2012-01-04 NOTE — Telephone Encounter (Signed)
Please let pt know

## 2012-01-05 NOTE — Telephone Encounter (Signed)
I found stone composition in chart, advised patient of results.

## 2012-03-28 ENCOUNTER — Other Ambulatory Visit: Payer: Self-pay

## 2012-03-28 MED ORDER — ROSUVASTATIN CALCIUM 10 MG PO TABS: 10.0000 mg | ORAL_TABLET | Freq: Every day | ORAL | Status: DC

## 2012-03-28 NOTE — Telephone Encounter (Signed)
Pt left v/m requesting Crestor 90 day refill with years  refill to [facility]. Left v/m refill done.

## 2012-04-20 ENCOUNTER — Encounter: Payer: Self-pay | Admitting: Family Medicine

## 2012-04-20 ENCOUNTER — Ambulatory Visit (INDEPENDENT_AMBULATORY_CARE_PROVIDER_SITE_OTHER): Payer: 59 | Admitting: Family Medicine

## 2012-04-20 VITALS — BP 145/90 | HR 64 | Temp 97.5°F | Wt 156.0 lb

## 2012-04-20 DIAGNOSIS — M25559 Pain in unspecified hip: Secondary | ICD-10-CM

## 2012-04-20 DIAGNOSIS — IMO0001 Reserved for inherently not codable concepts without codable children: Secondary | ICD-10-CM | POA: Insufficient documentation

## 2012-04-20 DIAGNOSIS — R030 Elevated blood-pressure reading, without diagnosis of hypertension: Secondary | ICD-10-CM

## 2012-04-20 DIAGNOSIS — M25552 Pain in left hip: Secondary | ICD-10-CM

## 2012-04-20 NOTE — Patient Instructions (Addendum)
Good to see you. Please recheck your blood pressure at home- call me this week with the readings if it remains elevated.

## 2012-04-20 NOTE — Progress Notes (Signed)
Subjective:    Patient ID: Richard Ibarra, male    DOB: 1945-11-19, 66 y.o.   MRN: [id number]  HPI  66 yo healthy male here for:  Left hip pain.  Last weekend, lifted a tractor that was too heavy, rested it on his left leg. Heard no pop.  Had no swelling.  Two days later, left hip and thigh pain.  Required taking Ibuprofen at least 800 mg twice a day for several days. Now pain has improved dramatically. No Le weakness.  BP a little elevated today. No h/o HTN. BP Readings from Last 3 Encounters:  04/20/12 145/90  12/28/11 134/84  11/09/11 139/65   No HA, blurred vision, CP or SOB.  Patient Active Problem List  Diagnosis  . HYPOTHYROIDISM  . HYPERTENSION  . ALLERGIC RHINITIS  . ALLERGY  . Gross hematuria  . Routine general medical examination at a health care facility  . Travel foreign   Past Medical History  Diagnosis Date  . Allergy   . Hypertension   . Thyroid disease   . Seasonal allergies 11-02-11    Tx. Allegra daily  . Gross hematuria 10-30-11    story consistent with exercise induced, eval by uro with 1.9cm stone L kidney   Past Surgical History  Procedure Date  . Rash testing 02/17/1989    multiple positives  . Prostate biopsy 01/29/2006    Dr. Patsi Sears  . Cystoscopy 11/09/2011    Procedure: CYSTOSCOPY;  Surgeon: Kathi Ludwig, MD;  Location: [facility];  Service: Urology;  Laterality: N/A;   History  Substance Use Topics  . Smoking status: Never Smoker   . Smokeless tobacco: Not on file  . Alcohol Use: Not on file   Family History  Problem Relation Age of Onset  . Cerebral aneurysm Father    Allergies  Allergen Reactions  . Banana Swelling  . Food Swelling    All melons    Current Outpatient Prescriptions on File Prior to Visit  Medication Sig Dispense Refill  . ciprofloxacin (CIPRO) 500 MG tablet 1 tab by mouth twice daily x 3 days at onset of diarrhea, may repeat if necessary  20 tablet  0  . fexofenadine (ALLEGRA) 180 MG tablet Take  1 tablet (180 mg total) by mouth daily.  90 tablet  3  . finasteride (PROSCAR) 5 MG tablet Take 1 tablet (5 mg total) by mouth daily.  90 tablet  3  . ibuprofen (ADVIL,MOTRIN) 200 MG tablet Take 400 mg by mouth every 6 (six) hours as needed. PAIN      . levothyroxine (SYNTHROID, LEVOTHROID) 112 MCG tablet Take one by mouth daily  90 tablet  3  . metoprolol (LOPRESSOR) 50 MG tablet Take 1 tablet (50 mg total) by mouth daily before breakfast.  90 tablet  3  . niacin (NIASPAN) 1000 MG CR tablet Take 1 tablet (1,000 mg total) by mouth at bedtime.  90 tablet  3  . olmesartan (BENICAR) 20 MG tablet Take 1 tablet (20 mg total) by mouth at bedtime.  90 tablet  3  . rosuvastatin (CRESTOR) 10 MG tablet Take 1 tablet (10 mg total) by mouth at bedtime.  90 tablet  3   Current Facility-Administered Medications on File Prior to Visit  Medication Dose Route Frequency Provider Last Rate Last Dose  . opium-belladonna (B&O SUPPRETTES) suppository 1 suppository  1 suppository Rectal Once Kathi Ludwig, MD       The PMH, PSH, Social History, Family History, Medications, and  allergies have been reviewed in [facility], and have been updated if relevant.   Review of Systems See HPI    Objective:   Physical Exam BP 145/90  Pulse 64  Temp 97.5 F (36.4 C)  Wt 156 lb (70.761 kg) General:  overweght male in NAD Eyes:  PERRL Ears:  External ear exam shows no significant lesions or deformities.  Otoscopic examination reveals clear canals, tympanic membranes are intact bilaterally without bulging, retraction, inflammation or discharge. Hearing is grossly normal bilaterally. Nose:  External nasal examination shows no deformity or inflammation. Nasal mucosa are pink and moist without lesions or exudates. Mouth:  Oral mucosa and oropharynx without lesions or exudates.  Teeth in good repair. Neck:  no carotid bruit or thyromegaly no cervical or supraclavicular lymphadenopathy  Lungs:  Normal respiratory effort, chest  expands symmetrically. Lungs are clear to auscultation, no crackles or wheezes. Heart:  Normal rate and regular rhythm. S1 and S2 normal without gallop, murmur, click, rub or other extra sounds. Pulses:  R and L posterior tibial pulses are full and equal bilaterally  Extremities:  no edema  From of motion of hip- no pain with external or internal rotation. Does have mild pain to palp over left trochanteric bursa SLR neg bilaterally Normal gait      Assessment & Plan:   1. Hip pain, left  No red flag symptoms on exam Probable mild bursitis. No imaging necessary unless symptoms worsen. Call or return to clinic prn if these symptoms worsen or fail to improve as anticipated.   2. Elevated BP  New- likely due to recent increased NSAID use. Asymptomatic. Has BP cuff at home. He will check BP at home and call me readings.

## 2012-12-02 ENCOUNTER — Other Ambulatory Visit: Payer: Self-pay | Admitting: Family Medicine

## 2012-12-04 LAB — COMPREHENSIVE METABOLIC PANEL
Albumin: 4.1 g/dL (ref 3.6–4.8)
BUN: 25 mg/dL (ref 8–27)
CO2: 21 mmol/L (ref 19–28)
Chloride: 102 mmol/L (ref 97–108)
Creatinine, Ser: 1.22 mg/dL (ref 0.76–1.27)
GFR calc Af Amer: 70 mL/min/{1.73_m2} (ref >59)
Globulin, Total: 2.4 g/dL (ref 1.5–4.5)
Glucose: 106 mg/dL — ABNORMAL HIGH (ref 65–99)
Total Bilirubin: 0.2 mg/dL (ref 0.0–1.2)
Total Protein: 6.5 g/dL (ref 6.0–8.5)

## 2012-12-04 LAB — NMR, LIPOPROFILE
HDL Cholesterol by NMR: 47 mg/dL (ref >=40)
LDL Particle Number: 950 nmol/L (ref <1000)
Small LDL Particle Number: 428 nmol/L (ref <=527)

## 2012-12-04 LAB — LIPID PANEL W/O CHOL/HDL RATIO
Cholesterol, Total: 125 mg/dL (ref 100–199)
LDL Calculated: 59 mg/dL (ref 0–99)
Triglycerides: 87 mg/dL (ref 0–149)
VLDL Cholesterol Cal: 17 mg/dL (ref 5–40)

## 2012-12-08 ENCOUNTER — Ambulatory Visit (INDEPENDENT_AMBULATORY_CARE_PROVIDER_SITE_OTHER)
Admission: RE | Admit: 2012-12-08 | Discharge: 2012-12-08 | Disposition: A | Discharge status: Home / Self Care | Payer: 59 | Source: Ambulatory Visit | Attending: Family Medicine | Admitting: Family Medicine

## 2012-12-08 ENCOUNTER — Ambulatory Visit (INDEPENDENT_AMBULATORY_CARE_PROVIDER_SITE_OTHER): Payer: 59 | Admitting: Family Medicine

## 2012-12-08 ENCOUNTER — Encounter: Payer: Self-pay | Admitting: Family Medicine

## 2012-12-08 VITALS — BP 120/88 | HR 53 | Temp 97.6°F | Ht 64.5 in | Wt 152.0 lb

## 2012-12-08 DIAGNOSIS — M79609 Pain in unspecified limb: Secondary | ICD-10-CM

## 2012-12-08 DIAGNOSIS — I10 Essential (primary) hypertension: Secondary | ICD-10-CM

## 2012-12-08 DIAGNOSIS — E039 Hypothyroidism, unspecified: Secondary | ICD-10-CM

## 2012-12-08 DIAGNOSIS — Z1211 Encounter for screening for malignant neoplasm of colon: Secondary | ICD-10-CM

## 2012-12-08 DIAGNOSIS — Z0000 Encounter for general adult medical examination without abnormal findings: Secondary | ICD-10-CM

## 2012-12-08 DIAGNOSIS — M79645 Pain in left finger(s): Secondary | ICD-10-CM

## 2012-12-08 MED ORDER — METOPROLOL TARTRATE 50 MG PO TABS: 50.0000 mg | ORAL_TABLET | Freq: Every day | ORAL | Status: DC

## 2012-12-08 MED ORDER — FEXOFENADINE HCL 180 MG PO TABS: 180.0000 mg | ORAL_TABLET | Freq: Every day | ORAL | Status: DC

## 2012-12-08 MED ORDER — ROSUVASTATIN CALCIUM 10 MG PO TABS: 10.0000 mg | ORAL_TABLET | Freq: Every day | ORAL | Status: DC

## 2012-12-08 MED ORDER — OLMESARTAN MEDOXOMIL 20 MG PO TABS: 20.0000 mg | ORAL_TABLET | Freq: Every day | ORAL | Status: DC

## 2012-12-08 MED ORDER — LEVOTHYROXINE SODIUM 112 MCG PO TABS: ORAL_TABLET | ORAL | Status: DC

## 2012-12-08 MED ORDER — NIACIN ER (ANTIHYPERLIPIDEMIC) 1000 MG PO TBCR: 1000.0000 mg | EXTENDED_RELEASE_TABLET | Freq: Every day | ORAL | Status: DC

## 2012-12-08 NOTE — Addendum Note (Signed)
Addended by: Alvina Chou on: 12/08/2012 10:03 AM   Modules accepted: Orders

## 2012-12-08 NOTE — Patient Instructions (Addendum)
Good to see you. We will call you with your lab results.  Have a great weekend and your labs look fantastic!  Please pick up your stool cards in the lab.

## 2012-12-08 NOTE — Progress Notes (Signed)
Subjective:    Patient ID: Richard Ibarra, male    DOB: October 18, 1945, 67 y.o.   MRN: [id number]  HPI  67 yo pleasant male here for CPX.    Bladder stone- no recurrent stones or hematauria.  Saw  Dr. Patsi Sears last month.   Denies any urinary symptoms. Lab Results  Component Value Date   PSA 1.6 12/02/2012   PSA 2.3 09/23/2007   PSA 00 01/14/2007      HTN-  On Benicar 20 mg and Metroprolol 50 mg daily.  BP stable. Lab Results  Component Value Date   CREATININE 1.22 12/02/2012   HLD-  On niaspan 1000 mg daily and Crestor 10 mg daily.  Well controlled. Lab Results  Component Value Date   CHOL 118 12/02/2012   HDL 49 12/02/2012   LDLCALC 59 12/02/2012   TRIG 87 12/02/2012    Hypothyroidism- Remains on synthroid 112 mcg daily. Lab Results  Component Value Date   TSH 2.120 12/02/2012    Pt denies any symptoms of hypo or hyperthyroidism.  Still has not had a colonoscopy.  He is willing to do stool cards.  Left thumb pain- on and off for years but has noticed some atrophy in the base of his thumb now.  No decreased grip strength but does have pain when opening a jar or a door.  No erythema or warmth.  No family h/o inflammatory arthritis.  No other joint involvement.  Patient Active Problem List  Diagnosis  . HYPOTHYROIDISM  . HYPERTENSION  . ALLERGIC RHINITIS  . ALLERGY  . Gross hematuria  . Routine general medical examination at a health care facility  . Travel foreign  . Elevated BP   Past Medical History  Diagnosis Date  . Allergy   . Hypertension   . Thyroid disease   . Seasonal allergies 11-02-11    Tx. Allegra daily  . Gross hematuria 10-30-11    story consistent with exercise induced, eval by uro with 1.9cm stone L kidney   Past Surgical History  Procedure Laterality Date  . Rash testing  02/17/1989    multiple positives  . Prostate biopsy  01/29/2006    Dr. Patsi Sears  . Cystoscopy  11/09/2011    Procedure: CYSTOSCOPY;  Surgeon: Kathi Ludwig,  MD;  Location: [facility];  Service: Urology;  Laterality: N/A;   History  Substance Use Topics  . Smoking status: Never Smoker   . Smokeless tobacco: Not on file  . Alcohol Use: Not on file   Family History  Problem Relation Age of Onset  . Cerebral aneurysm Father    Allergies  Allergen Reactions  . Banana Swelling  . Food Swelling    All melons    Current Outpatient Prescriptions on File Prior to Visit  Medication Sig Dispense Refill  . ciprofloxacin (CIPRO) 500 MG tablet 1 tab by mouth twice daily x 3 days at onset of diarrhea, may repeat if necessary  20 tablet  0  . fexofenadine (ALLEGRA) 180 MG tablet Take 1 tablet (180 mg total) by mouth daily.  90 tablet  3  . finasteride (PROSCAR) 5 MG tablet Take 1 tablet (5 mg total) by mouth daily.  90 tablet  3  . ibuprofen (ADVIL,MOTRIN) 200 MG tablet Take 400 mg by mouth every 6 (six) hours as needed. PAIN      . levothyroxine (SYNTHROID, LEVOTHROID) 112 MCG tablet Take one by mouth daily  90 tablet  3  . metoprolol (LOPRESSOR) 50 MG tablet  Take 1 tablet (50 mg total) by mouth daily before breakfast.  90 tablet  3  . niacin (NIASPAN) 1000 MG CR tablet Take 1 tablet (1,000 mg total) by mouth at bedtime.  90 tablet  3  . olmesartan (BENICAR) 20 MG tablet Take 1 tablet (20 mg total) by mouth at bedtime.  90 tablet  3  . rosuvastatin (CRESTOR) 10 MG tablet Take 1 tablet (10 mg total) by mouth at bedtime.  90 tablet  3   Current Facility-Administered Medications on File Prior to Visit  Medication Dose Route Frequency Provider Last Rate Last Dose  . opium-belladonna (B&O SUPPRETTES) suppository 1 suppository  1 suppository Rectal Once Kathi Ludwig, MD       The PMH, PSH, Social History, Family History, Medications, and allergies have been reviewed in [facility], and have been updated if relevant.   Review of Systems    See HPI Patient reports no  vision/ hearing changes,anorexia, weight change, fever ,adenopathy, persistant / recurrent  hoarseness, swallowing issues, chest pain, edema,persistant / recurrent cough, hemoptysis, dyspnea(rest, exertional, paroxysmal nocturnal), gastrointestinal  bleeding (melena, rectal bleeding), abdominal pain, excessive heart burn, GU symptoms(dysuria, hematuria, pyuria, voiding/incontinence  Issues) syncope, focal weakness, severe memory loss, concerning skin lesions, depression, anxiety, abnormal bruising/bleeding, major joint swelling.    Objective:   Physical Exam BP 120/88  Pulse 53  Temp(Src) 97.6 F (36.4 C)  Ht 5' 4.5" (1.638 m)  Wt 152 lb (68.947 kg)  BMI 25.7 kg/m2   .General:  overweght male in NAD Eyes:  PERRL Ears:  External ear exam shows no significant lesions or deformities.  Otoscopic examination reveals clear canals, tympanic membranes are intact bilaterally without bulging, retraction, inflammation or discharge. Hearing is grossly normal bilaterally. Nose:  External nasal examination shows no deformity or inflammation. Nasal mucosa are pink and moist without lesions or exudates. Mouth:  Oral mucosa and oropharynx without lesions or exudates.  Teeth in good repair. Neck:  no carotid bruit or thyromegaly no cervical or supraclavicular lymphadenopathy  Lungs:  Normal respiratory effort, chest expands symmetrically. Lungs are clear to auscultation, no crackles or wheezes. Heart:  Normal rate and regular rhythm. S1 and S2 normal without gallop, murmur, click, rub or other extra sounds. Abdomen:  Bowel sounds positive,abdomen soft and non-tender without masses, organomegaly or hernias noted. Prostate:  Deferred- recently followed by urology Pulses:  R and L posterior tibial pulses are full and equal bilaterally  Extremities:  no edema  Left hand: Does have atrophy of thenar muscles, TTP over left base of thumb, no warmth or effusion    Assessment & Plan:  1. Routine general medical examination at a health care facility Reviewed preventive care protocols, scheduled due  services, and updated immunizations Discussed nutrition, exercise, diet, and healthy lifestyle.   2. HYPOTHYROIDISM Well controlled on current dose of synthroid. No changes.   3. HYPERTENSION BP well controlled.  4. Special screening for malignant neoplasms, colon  - Fecal occult blood, imunochemical; Future  5. Pain of left thumb Chronic and persistent now with atrophy.  No known trauma or injury.  Will get hand film today and lab work for further evaluation.  Will likely need referral to hand surgeon but he would like to hold off on this for now.    - DG Hand Complete Left; Future - Rheumatoid factor - Sedimentation Rate - High sensitivity CRP - ANA - Cyclic Citrul Peptide Antibody, IGG - CBC with Differential

## 2012-12-09 LAB — SEDIMENTATION RATE: Sed Rate: 2 mm/hr (ref 0–30)

## 2012-12-09 LAB — ANA: Anti Nuclear Antibody(ANA): NEGATIVE

## 2012-12-09 LAB — RHEUMATOID FACTOR: Rhuematoid fact SerPl-aCnc: 7.5 IU/mL (ref 0.0–13.9)

## 2012-12-09 LAB — CBC WITH DIFFERENTIAL/PLATELET
Eos: 5 % (ref 0–5)
Immature Grans (Abs): 0 10*3/uL (ref 0.0–0.1)
Immature Granulocytes: 0 % (ref 0–2)
Lymphs: 20 % (ref 14–46)
Monocytes Absolute: 1.1 10*3/uL — ABNORMAL HIGH (ref 0.1–0.9)
Monocytes: 14 % — ABNORMAL HIGH (ref 4–12)
Neutrophils Relative %: 60 % (ref 40–74)
RDW: 13.7 % (ref 12.3–15.4)
WBC: 7.7 10*3/uL (ref 3.4–10.8)

## 2012-12-12 LAB — CYCLIC CITRUL PEPTIDE ANTIBODY, IGG/IGA: Cyclic Citrullin Peptide Ab: 10 units (ref 0–19)

## 2012-12-12 LAB — SPECIMEN STATUS REPORT

## 2013-02-21 IMAGING — CR DG CHEST 2V
2 series · 2 of 2 positions shown · non-contrast
Comparison: None

CLINICAL DATA: Preop radiograph.  Bladder calculi.

CHEST - 2 VIEW

[w chest pa]
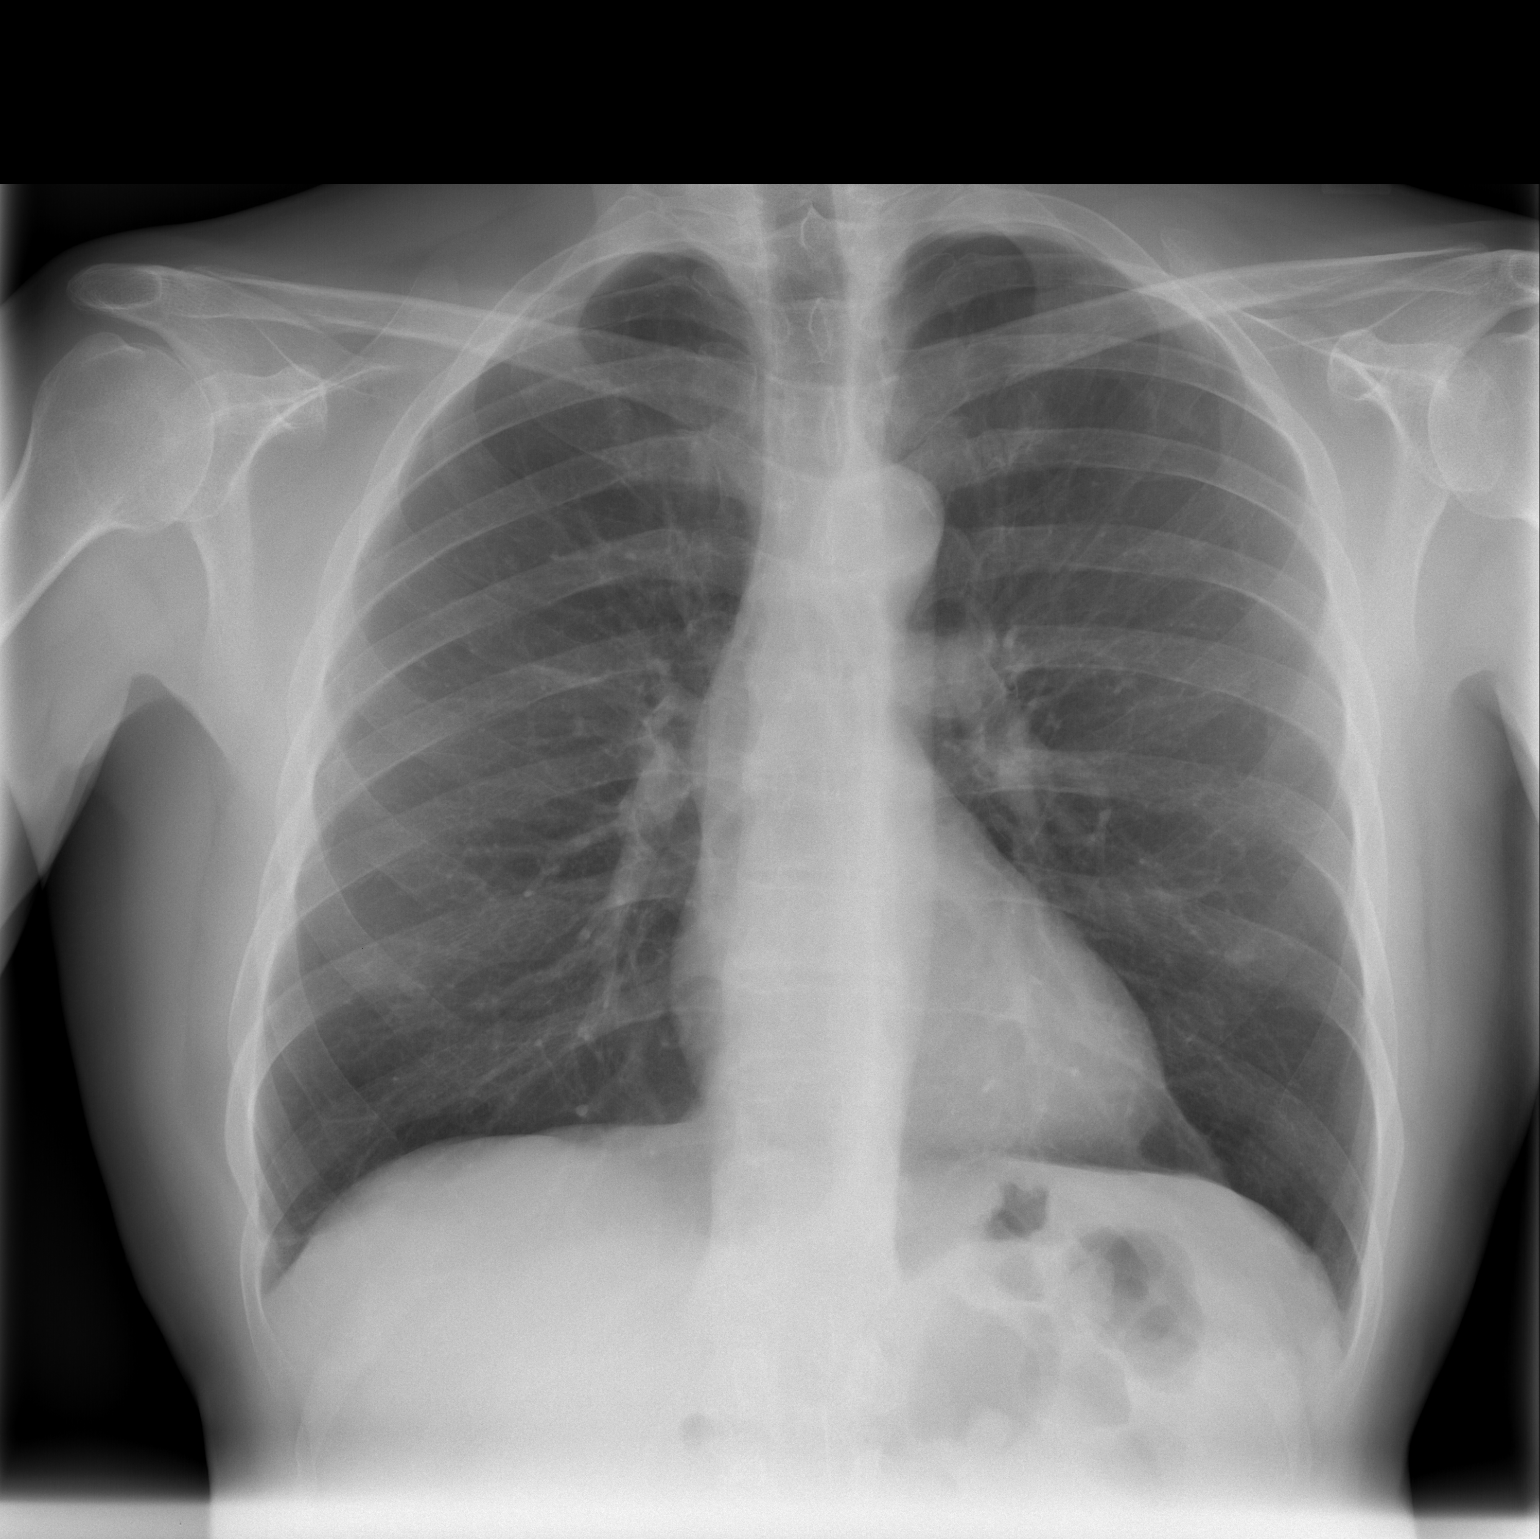

[w chest lat]
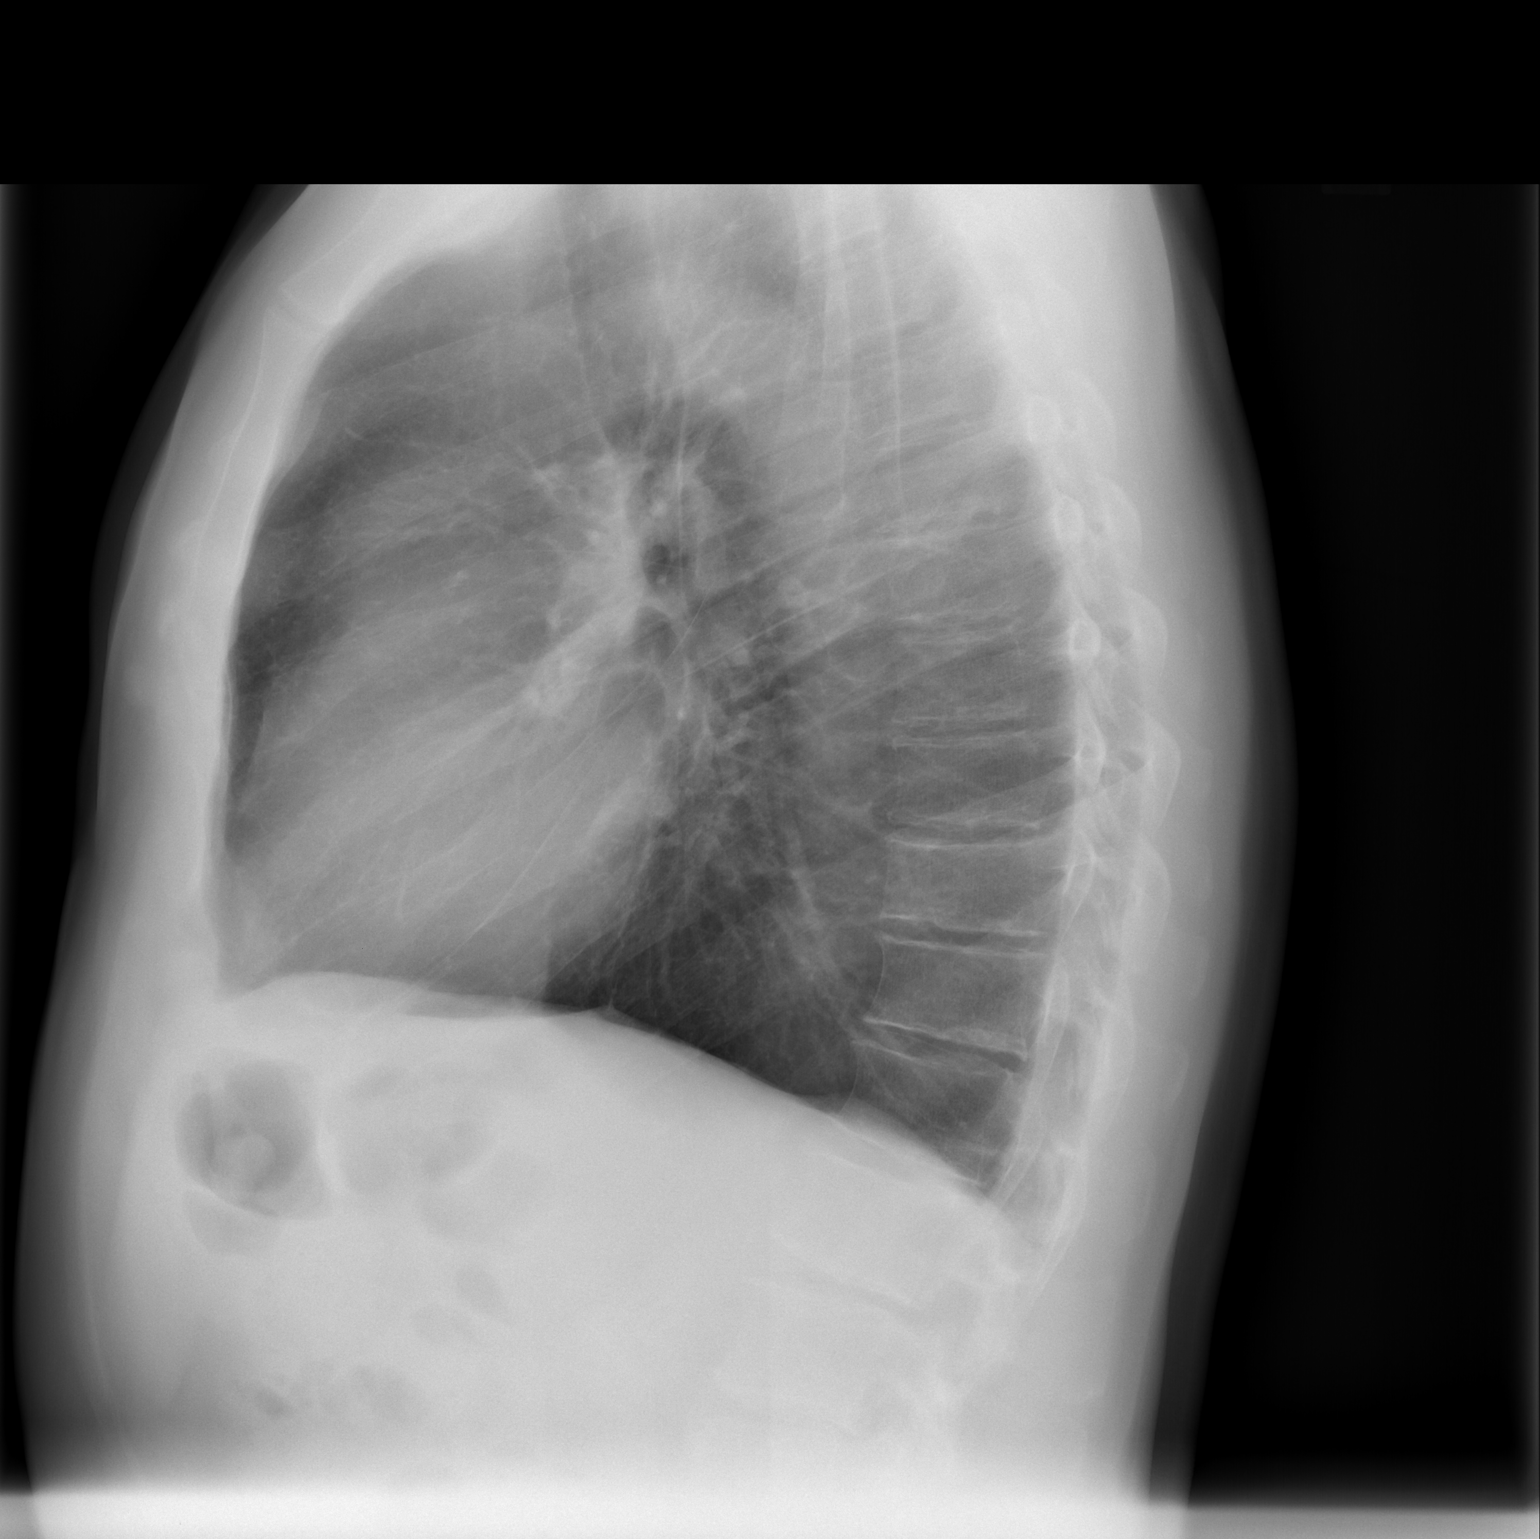

[2 of 2 positions shown; findings below may reference images not displayed]

FINDINGS: The heart size and mediastinal contours are within normal
limits.  Both lungs are clear.  The visualized skeletal structures
are unremarkable.
IMPRESSION: No active cardiopulmonary abnormalities

## 2013-11-30 ENCOUNTER — Telehealth: Payer: Self-pay

## 2013-11-30 NOTE — Telephone Encounter (Signed)
Pt left v/m; pt request written script for lab test Dr Deborra Medina wants ordered prior to CPX on 12/19/13; pt request NMR,lipoprofile to be done and any other testing Dr Deborra Medina request. Pt goes to [facility] for blood to be drawn.Dr Hartley Barefoot has ordered PSA already. Pts wife will pick up rx on 12/01/13 and pt did not leave contact #.

## 2013-12-01 NOTE — Telephone Encounter (Signed)
Rx written and in my box. 

## 2013-12-01 NOTE — Telephone Encounter (Signed)
Spoke to pt and informed him orders are available for pickup; pt states that he gives wife permission to pickup

## 2013-12-04 LAB — BASIC METABOLIC PANEL: CREATININE: 1.6 mg/dL — AB (ref 0.6–1.3)

## 2013-12-04 LAB — LIPID PANEL
Cholesterol: 156 mg/dL (ref 0–200)
HDL: 52 mg/dL (ref 35–70)
LDL CALC: 80 mg/dL
TRIGLYCERIDES: 118 mg/dL (ref 40–160)

## 2013-12-04 LAB — CBC AND DIFFERENTIAL
HCT: 43 % (ref 41–53)
Hemoglobin: 15 g/dL (ref 13.5–17.5)

## 2013-12-04 LAB — TSH: TSH: 2.54 u[IU]/mL (ref 0.41–5.90)

## 2013-12-19 ENCOUNTER — Encounter: Payer: Self-pay | Admitting: Family Medicine

## 2013-12-19 ENCOUNTER — Ambulatory Visit (INDEPENDENT_AMBULATORY_CARE_PROVIDER_SITE_OTHER): Payer: 59 | Admitting: Family Medicine

## 2013-12-19 VITALS — BP 116/74 | HR 50 | Temp 97.8°F | Ht 64.5 in | Wt 157.2 lb

## 2013-12-19 DIAGNOSIS — I10 Essential (primary) hypertension: Secondary | ICD-10-CM

## 2013-12-19 DIAGNOSIS — R799 Abnormal finding of blood chemistry, unspecified: Secondary | ICD-10-CM

## 2013-12-19 DIAGNOSIS — R7989 Other specified abnormal findings of blood chemistry: Secondary | ICD-10-CM

## 2013-12-19 DIAGNOSIS — Z0000 Encounter for general adult medical examination without abnormal findings: Secondary | ICD-10-CM

## 2013-12-19 DIAGNOSIS — N183 Chronic kidney disease, stage 3 unspecified: Secondary | ICD-10-CM | POA: Insufficient documentation

## 2013-12-19 DIAGNOSIS — J309 Allergic rhinitis, unspecified: Secondary | ICD-10-CM

## 2013-12-19 DIAGNOSIS — Z1211 Encounter for screening for malignant neoplasm of colon: Secondary | ICD-10-CM

## 2013-12-19 DIAGNOSIS — E039 Hypothyroidism, unspecified: Secondary | ICD-10-CM

## 2013-12-19 MED ORDER — METOPROLOL TARTRATE 50 MG PO TABS: 50.0000 mg | ORAL_TABLET | Freq: Every day | ORAL | Status: DC

## 2013-12-19 MED ORDER — LEVOTHYROXINE SODIUM 112 MCG PO TABS: ORAL_TABLET | ORAL | Status: DC

## 2013-12-19 MED ORDER — ROSUVASTATIN CALCIUM 10 MG PO TABS: 10.0000 mg | ORAL_TABLET | Freq: Every day | ORAL | Status: DC

## 2013-12-19 MED ORDER — NIACIN ER (ANTIHYPERLIPIDEMIC) 1000 MG PO TBCR: 1000.0000 mg | EXTENDED_RELEASE_TABLET | Freq: Every day | ORAL | Status: DC

## 2013-12-19 MED ORDER — OLMESARTAN MEDOXOMIL 20 MG PO TABS: 20.0000 mg | ORAL_TABLET | Freq: Every day | ORAL | Status: DC

## 2013-12-19 MED ORDER — FINASTERIDE 5 MG PO TABS: 5.0000 mg | ORAL_TABLET | Freq: Every day | ORAL | Status: DC

## 2013-12-19 NOTE — Progress Notes (Signed)
Subjective:    Patient ID: Richard Ibarra, male    DOB: [birth year], 68 y.o.   MRN: [id number]  HPI  68 yo pleasant male here for CPX.   Td 11/04/2011 Zostavax 03/10/2010 Pneumovax 11/04/11  Bladder stone- no recurrent stones or hematauria.  Sees Dr. Gaynelle Arabian, last saw him in 10/2013.   Denies any urinary symptoms.  Per pt, DRE was normal. Lab Results  Component Value Date   PSA 1.6 12/02/2012   PSA 2.3 09/23/2007   PSA 00 01/14/2007   Elevated Cr- with elevated BUN. Denied being dehydrated or taking any NSAIDs prior to lab draw.  Admits to traveling a lot and not drinking much water.   HTN-  On Benicar 20 mg and Metroprolol 50 mg daily.  BP stable.   Lab Results  Component Value Date   CREATININE 1.6* 12/04/2013   HLD-  On niaspan 1000 mg daily and Crestor 10 mg daily.  Well controlled. Lab Results  Component Value Date   CHOL 156 12/04/2013   HDL 52 12/04/2013   LDLCALC 80 12/04/2013   TRIG 118 12/04/2013    Hypothyroidism- Remains on synthroid 112 mcg daily. Lab Results  Component Value Date   TSH 2.54 12/04/2013    Pt denies any symptoms of hypo or hyperthyroidism.  Still has not had a colonoscopy but he is now willing to have this done. Patient Active Problem List   Diagnosis Date Noted  . Travel foreign 12/28/2011  . Routine general medical examination at a health care facility 11/04/2011  . HYPOTHYROIDISM 01/12/2007  . HYPERTENSION 01/12/2007  . ALLERGIC RHINITIS 01/12/2007   Past Medical History  Diagnosis Date  . Allergy   . Hypertension   . Thyroid disease   . Seasonal allergies 11-02-11    Tx. Allegra daily  . Gross hematuria 10-30-11    story consistent with exercise induced, eval by uro with 1.9cm stone L kidney   Past Surgical History  Procedure Laterality Date  . Rash testing  02/17/1989    multiple positives  . Prostate biopsy  01/29/2006    Dr. Gaynelle Arabian  . Cystoscopy  11/09/2011    Procedure: CYSTOSCOPY;  Surgeon: Ailene Rud, MD;   Location: [facility];  Service: Urology;  Laterality: N/A;   History  Substance Use Topics  . Smoking status: Never Smoker   . Smokeless tobacco: Not on file  . Alcohol Use: Not on file   Family History  Problem Relation Age of Onset  . Cerebral aneurysm Father    Allergies  Allergen Reactions  . Banana Swelling  . Food Swelling    All melons    Current Outpatient Prescriptions on File Prior to Visit  Medication Sig Dispense Refill  . fexofenadine (ALLEGRA) 180 MG tablet Take 1 tablet (180 mg total) by mouth daily.  90 tablet  3   Current Facility-Administered Medications on File Prior to Visit  Medication Dose Route Frequency Provider Last Rate Last Dose  . opium-belladonna (B&O SUPPRETTES) suppository 1 suppository  1 suppository Rectal Once Ailene Rud, MD       The PMH, PSH, Social History, Family History, Medications, and allergies have been reviewed in [facility], and have been updated if relevant.   Review of Systems    See HPI Patient reports no  vision/ hearing changes,anorexia, weight change, fever ,adenopathy, persistant / recurrent hoarseness, swallowing issues, chest pain, edema,persistant / recurrent cough, hemoptysis, dyspnea(rest, exertional, paroxysmal nocturnal), gastrointestinal  bleeding (melena, rectal bleeding), abdominal pain, excessive heart  burn, GU symptoms(dysuria, hematuria, pyuria, voiding/incontinence  Issues) syncope, focal weakness, severe memory loss, concerning skin lesions, depression, anxiety, abnormal bruising/bleeding, major joint swelling.    Objective:   Physical Exam BP 116/74  Pulse 50  Temp(Src) 97.8 F (36.6 C) (Oral)  Ht 5' 4.5" (1.638 m)  Wt 157 lb 4 oz (71.328 kg)  BMI 26.58 kg/m2  SpO2 98%   .General:  overweght male in NAD Eyes:  PERRL Ears:  External ear exam shows no significant lesions or deformities.  Otoscopic examination reveals clear canals, tympanic membranes are intact bilaterally without bulging, retraction,  inflammation or discharge. Hearing is grossly normal bilaterally. Nose:  External nasal examination shows no deformity or inflammation. Nasal mucosa are pink and moist without lesions or exudates. Mouth:  Oral mucosa and oropharynx without lesions or exudates.  Teeth in good repair. Neck:  no carotid bruit or thyromegaly no cervical or supraclavicular lymphadenopathy  Lungs:  Normal respiratory effort, chest expands symmetrically. Lungs are clear to auscultation, no crackles or wheezes. Heart:  Normal rate and regular rhythm. S1 and S2 normal without gallop, murmur, click, rub or other extra sounds. Abdomen:  Bowel sounds positive,abdomen soft and non-tender without masses, organomegaly or hernias noted. Prostate:  Deferred- recently followed by urology Pulses:  R and L posterior tibial pulses are full and equal bilaterally  Extremities:  no edema      Assessment & Plan:

## 2013-12-19 NOTE — Assessment & Plan Note (Signed)
Stable on current rx. No changes. 

## 2013-12-19 NOTE — Assessment & Plan Note (Signed)
Reviewed preventive care protocols, scheduled due services, and updated immunizations Discussed nutrition, exercise, diet, and healthy lifestyle.  GI referral for screening colonoscopy.

## 2013-12-19 NOTE — Patient Instructions (Addendum)
Great to see you. Please fax your 24 hour results to me- [phone].  I will fax labs to Dr. Gaynelle Arabian.  We will call you with your GI referral.

## 2013-12-19 NOTE — Assessment & Plan Note (Signed)
?  prerenal. Will recheck renal function panel and adjust meds once we have this confirmed. The patient indicates understanding of these issues and agrees with the plan. Dr. Gaynelle Arabian did check 24 hour urine in 10/2013- pt will fax me a copy of this.

## 2013-12-19 NOTE — Assessment & Plan Note (Signed)
Well controlled on current rx. No changes. 

## 2013-12-19 NOTE — Progress Notes (Signed)
Pre visit review using our clinic review tool, if applicable. No additional management support is needed unless otherwise documented below in the visit note. 

## 2013-12-20 ENCOUNTER — Encounter: Payer: Self-pay | Admitting: Family Medicine

## 2013-12-21 ENCOUNTER — Encounter: Payer: Self-pay | Admitting: *Deleted

## 2014-01-09 ENCOUNTER — Encounter: Payer: Self-pay | Admitting: Family Medicine

## 2014-01-22 ENCOUNTER — Encounter: Payer: Self-pay | Admitting: Family Medicine

## 2014-03-30 IMAGING — CR DG HAND COMPLETE 3+V*L*
3 series · 3 of 3 positions shown · non-contrast
Comparison: None.

CLINICAL DATA: History of painful thumb.  History of tendon
atrophic changes.

LEFT HAND - COMPLETE 3+ VIEW

[view not recorded (1 of 3)]
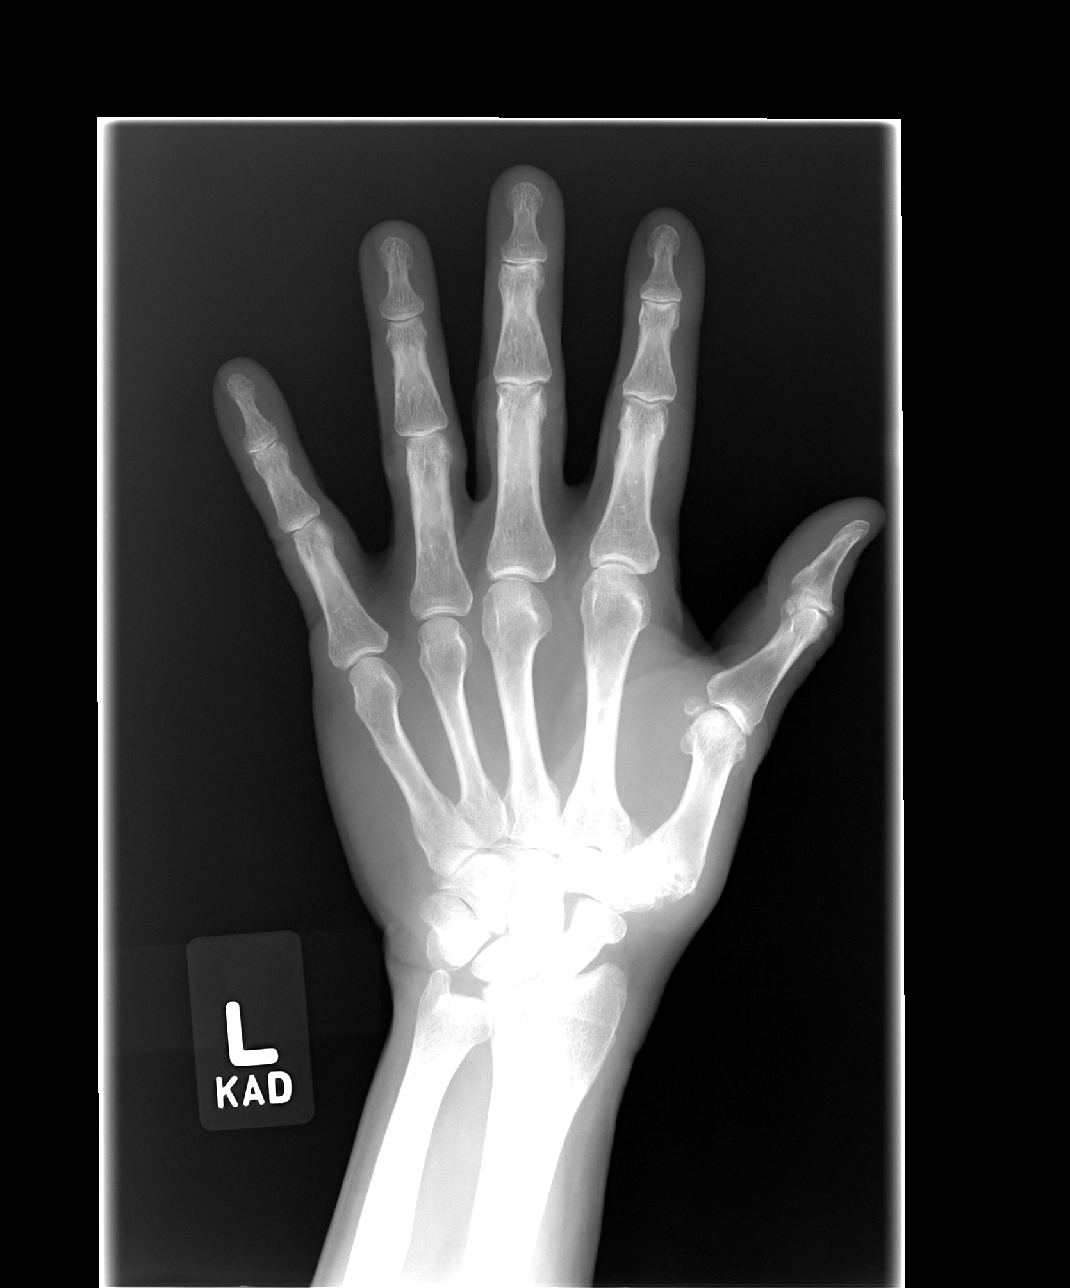

[view not recorded (2 of 3)]
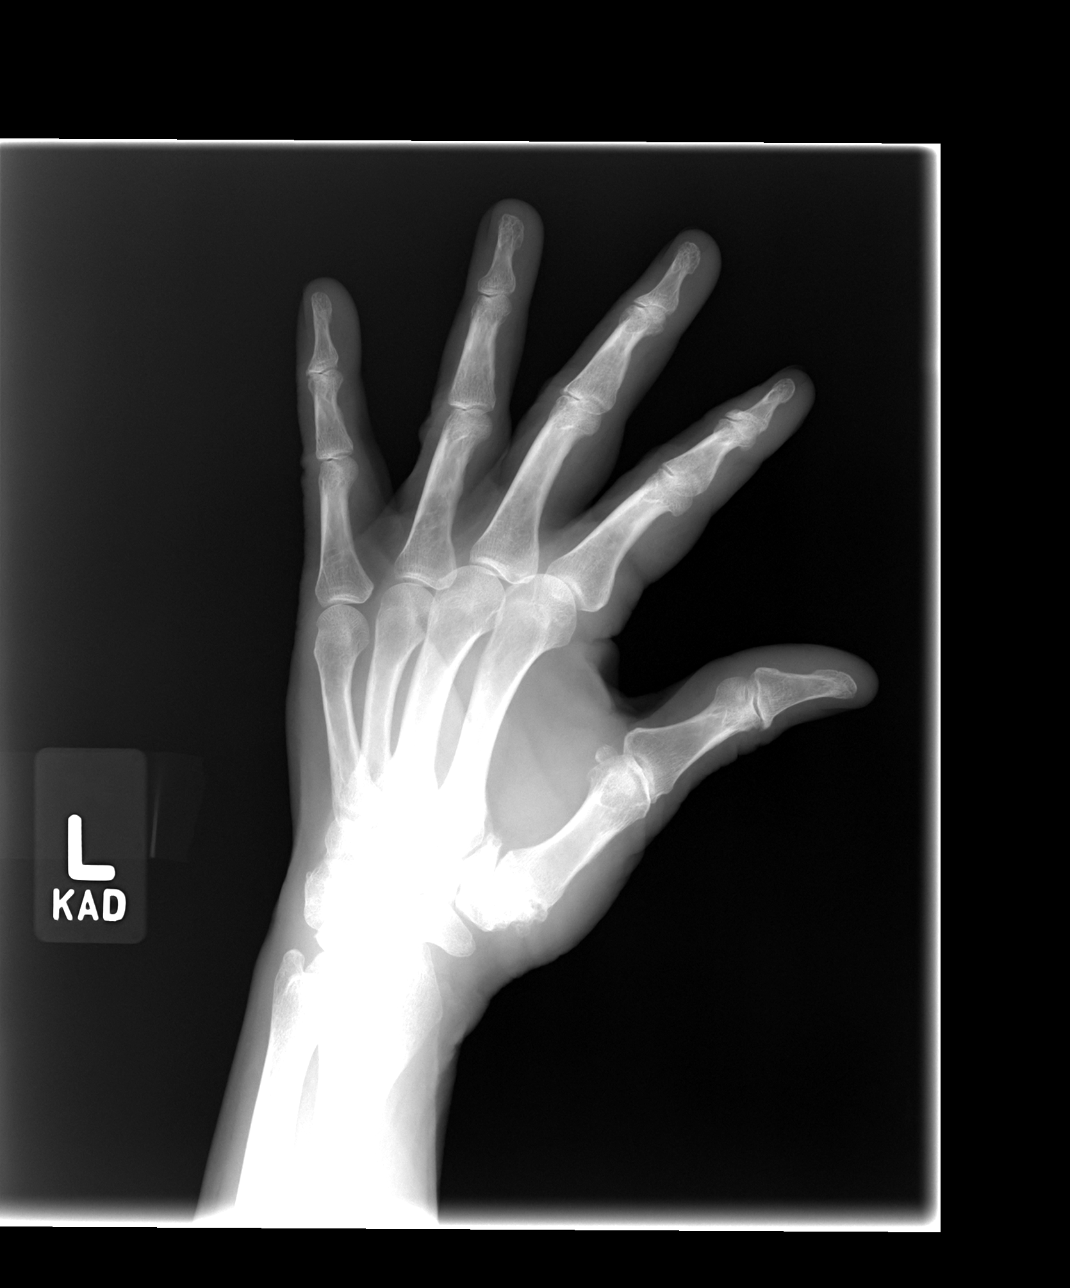

[view not recorded (3 of 3)]
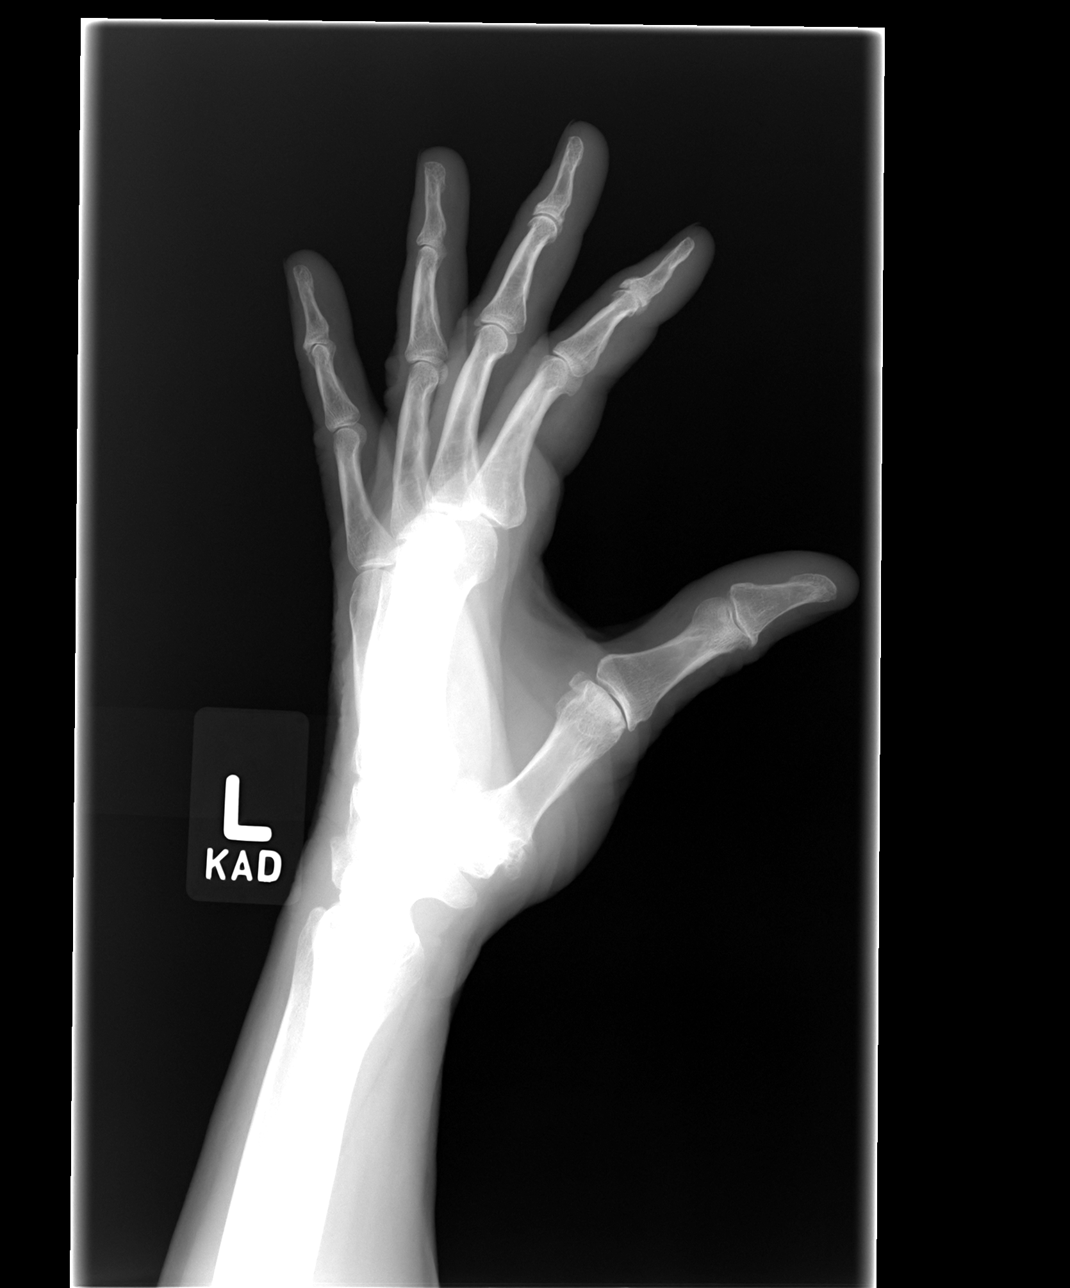

[3 of 3 positions shown; findings below may reference images not displayed]

FINDINGS: There is narrowing of joint space between trapezium and
first metacarpal with some sclerosis, subcortical cyst formation,
and marginal osteophyte formation representing degenerative joint
osteoarthritic changes.  There is minimal spurring of the distal
phalanx of the thumb at the IP joint of the thumb.  DIP joint
spurring is present representing degenerative joint spurring
involving the second and fifth fingers. No fracture or erosive
change is seen.  No chondrocalcinosis is evident.
IMPRESSION: Advanced degenerative joint osteoarthritic changes involving the
trapezium - first metacarpal joint area.  Degenerative joint
spurring involving IP joint of the thumb and DIP joint areas of
second and fifth fingers.

## 2014-05-26 ENCOUNTER — Other Ambulatory Visit: Payer: Self-pay | Admitting: Family Medicine

## 2014-06-28 ENCOUNTER — Telehealth: Payer: Self-pay | Admitting: *Deleted

## 2014-06-28 MED ORDER — NIACIN ER (ANTIHYPERLIPIDEMIC) 1000 MG PO TBCR: 1000.0000 mg | EXTENDED_RELEASE_TABLET | Freq: Every day | ORAL | Status: DC

## 2014-06-28 MED ORDER — OLMESARTAN MEDOXOMIL 20 MG PO TABS: 20.0000 mg | ORAL_TABLET | Freq: Every day | ORAL | Status: DC

## 2014-06-28 MED ORDER — METOPROLOL TARTRATE 50 MG PO TABS: 50.0000 mg | ORAL_TABLET | Freq: Every day | ORAL | Status: DC

## 2014-06-28 MED ORDER — LEVOTHYROXINE SODIUM 112 MCG PO TABS: ORAL_TABLET | ORAL | Status: DC

## 2014-06-28 NOTE — Telephone Encounter (Signed)
Pt contacted office requesting medication refill. Rx sent to pharmacy and f/u appt scheduled.

## 2014-07-11 ENCOUNTER — Ambulatory Visit: Payer: 59 | Admitting: Family Medicine

## 2014-08-01 ENCOUNTER — Encounter: Payer: Self-pay | Admitting: Family Medicine

## 2014-08-02 ENCOUNTER — Encounter: Payer: Self-pay | Admitting: Family Medicine

## 2014-08-03 ENCOUNTER — Encounter: Payer: Self-pay | Admitting: *Deleted

## 2014-08-27 ENCOUNTER — Other Ambulatory Visit: Payer: Self-pay | Admitting: Family Medicine

## 2014-08-27 MED ORDER — FINASTERIDE 5 MG PO TABS: 5.0000 mg | ORAL_TABLET | Freq: Every day | ORAL | Status: DC

## 2014-10-16 ENCOUNTER — Other Ambulatory Visit: Payer: Self-pay | Admitting: Family Medicine

## 2014-10-24 ENCOUNTER — Other Ambulatory Visit: Payer: Self-pay | Admitting: Family Medicine

## 2014-10-29 ENCOUNTER — Other Ambulatory Visit: Payer: Self-pay | Admitting: Family Medicine

## 2014-12-02 ENCOUNTER — Other Ambulatory Visit: Payer: Self-pay | Admitting: Family Medicine

## 2014-12-03 NOTE — Telephone Encounter (Signed)
Last ov 12/19/13, last filled 10/25/14 with the note stating patient needs OV before medication to be filled. Scheduled for CPE 12/24/14

## 2014-12-14 ENCOUNTER — Other Ambulatory Visit: Payer: Self-pay | Admitting: Family Medicine

## 2014-12-24 ENCOUNTER — Encounter: Payer: Self-pay | Admitting: Family Medicine

## 2014-12-24 ENCOUNTER — Ambulatory Visit (INDEPENDENT_AMBULATORY_CARE_PROVIDER_SITE_OTHER): Payer: 59 | Admitting: Family Medicine

## 2014-12-24 VITALS — BP 122/68 | HR 53 | Temp 98.1°F | Ht 64.25 in | Wt 154.8 lb

## 2014-12-24 DIAGNOSIS — Z0000 Encounter for general adult medical examination without abnormal findings: Secondary | ICD-10-CM | POA: Diagnosis not present

## 2014-12-24 DIAGNOSIS — K4090 Unilateral inguinal hernia, without obstruction or gangrene, not specified as recurrent: Secondary | ICD-10-CM | POA: Insufficient documentation

## 2014-12-24 DIAGNOSIS — E785 Hyperlipidemia, unspecified: Secondary | ICD-10-CM | POA: Diagnosis not present

## 2014-12-24 DIAGNOSIS — Z23 Encounter for immunization: Secondary | ICD-10-CM

## 2014-12-24 DIAGNOSIS — E038 Other specified hypothyroidism: Secondary | ICD-10-CM | POA: Diagnosis not present

## 2014-12-24 DIAGNOSIS — I10 Essential (primary) hypertension: Secondary | ICD-10-CM

## 2014-12-24 MED ORDER — OLMESARTAN MEDOXOMIL 20 MG PO TABS: ORAL_TABLET | ORAL | Status: DC

## 2014-12-24 MED ORDER — METOPROLOL TARTRATE 50 MG PO TABS: ORAL_TABLET | ORAL | Status: DC

## 2014-12-24 MED ORDER — LEVOTHYROXINE SODIUM 112 MCG PO TABS: ORAL_TABLET | ORAL | Status: DC

## 2014-12-24 MED ORDER — ROSUVASTATIN CALCIUM 10 MG PO TABS: 10.0000 mg | ORAL_TABLET | Freq: Every day | ORAL | Status: DC

## 2014-12-24 NOTE — Assessment & Plan Note (Signed)
Reviewed preventive care protocols, scheduled due services, and updated immunizations Discussed nutrition, exercise, diet, and healthy lifestyle.  Prevnar 13 today.  Lab order given to pt- IFOB, PSA, lipids, CMET, TSH, FT4, CBC.

## 2014-12-24 NOTE — Progress Notes (Signed)
Pre visit review using our clinic review tool, if applicable. No additional management support is needed unless otherwise documented below in the visit note. 

## 2014-12-24 NOTE — Assessment & Plan Note (Signed)
Due for labs. Current dose of synthroid refilled. Works for [employer]- request lab order which was given to him today.

## 2014-12-24 NOTE — Assessment & Plan Note (Signed)
New- unable to feel a hernia today.  Refer to gen surgery as pt is quite confident that he does have an inguinal hernia.

## 2014-12-24 NOTE — Assessment & Plan Note (Signed)
Well controlled on current rx.  He has decided he would like to continue his current rxs, including Benicar, and just pay out of pocket for them. Rx printed and given to him today.

## 2014-12-24 NOTE — Assessment & Plan Note (Signed)
Prefers to continue crestor. Rx given to pt.

## 2014-12-24 NOTE — Addendum Note (Signed)
Addended by: Modena Nunnery on: 12/24/2014 09:06 AM   Modules accepted: Orders

## 2014-12-24 NOTE — Patient Instructions (Signed)
Good to see you. Please stop by to see Richard Ibarra on your way out. 

## 2014-12-24 NOTE — Progress Notes (Signed)
Subjective:    Patient ID: Richard Ibarra, male    DOB: 09/17/1945, 69 y.o.   MRN: [id number]  HPI  69 yo pleasant male here for CPX and to review chronic medical conditions.  Has to change his chronic medications to alternatives based on what his insurance will cover. .   Td 11/04/2011 Zostavax 03/10/2010 Pneumovax 11/04/11 Saw Dr. Evorn Gong (dermatology)- past 3 weeks, BCC removal.  Follows up every 6 months.  Doing well- still traveling a lot but enjoying it.  His 10 grandchildren are doing well.  Bladder stone- no recurrent stones or hematauria.  Sees Dr. Gaynelle Arabian, last saw him in 6 weeks ago.   Denies any urinary symptoms.  Per pt, DRE was normal. Lab Results  Component Value Date   PSA 1.6 12/02/2012   PSA 2.3 09/23/2007   PSA 00 01/14/2007     HTN-  On Benicar 20 mg and Metroprolol 50 mg daily.  BP stable.  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 Lab Results  Component Value Date   CREATININE 1.6* 12/04/2013   HLD-  On niaspan 1000 mg daily and Crestor 10 mg daily.  Well controlled  Lab Results  Component Value Date   CHOL 156 12/04/2013   HDL 52 12/04/2013   LDLCALC 80 12/04/2013   TRIG 118 12/04/2013    Hypothyroidism- Remains on synthroid 112 mcg daily. Lab Results  Component Value Date   TSH 2.54 12/04/2013    Pt denies any symptoms of hypo or hyperthyroidism.  Still has not had a colonoscopy- will to do stool cards.  Right inguinal hernia- he has felt a hernia on right side, sometimes aches.  Started approx 6 months ago. Denies any real pain.  It is reducible. Patient Active Problem List   Diagnosis Date Noted  . HLD (hyperlipidemia) 12/24/2014  . Elevated serum creatinine 12/19/2013  . Travel foreign 12/28/2011  . Routine general medical examination at a health care facility 11/04/2011  . Hypothyroidism 01/12/2007  . Essential hypertension 01/12/2007  . ALLERGIC RHINITIS 01/12/2007   Past Medical History   Diagnosis Date  . Allergy   . Hypertension   . Thyroid disease   . Seasonal allergies 11-02-11    Tx. Allegra daily  . Gross hematuria 10-30-11    story consistent with exercise induced, eval by uro with 1.9cm stone L kidney  . BCC (basal cell carcinoma of skin)    Past Surgical History  Procedure Laterality Date  . Rash testing  02/17/1989    multiple positives  . Prostate biopsy  01/29/2006    Dr. Gaynelle Arabian  . Cystoscopy  11/09/2011    Procedure: CYSTOSCOPY;  Surgeon: Ailene Rud, MD;  Location: [facility];  Service: Urology;  Laterality: N/A;   History  Substance Use Topics  . Smoking status: Never Smoker   . Smokeless tobacco: Not on file  . Alcohol Use: Not on file   Family History  Problem Relation Age of Onset  . Cerebral aneurysm Father    Allergies  Allergen Reactions  . Banana Swelling  . Food Swelling    All melons    Current Outpatient Prescriptions on File Prior to Visit  Medication Sig Dispense Refill  . BENICAR 20 MG tablet Take 1 tablet by mouth at  bedtime 90 tablet 0  . fexofenadine (ALLEGRA) 180 MG tablet Take 1 tablet (180 mg total) by mouth daily. 90 tablet 3  . finasteride (PROSCAR) 5 MG tablet Take 1  tablet (5 mg total) by mouth daily. 90 tablet 0  . NIASPAN 1000 MG CR tablet Take 1 tablet by mouth at  bedtime (office visit with  labs required for  additional refills) 30 tablet 6  . rosuvastatin (CRESTOR) 10 MG tablet Take 1 tablet (10 mg total) by mouth at bedtime. 90 tablet 3   Current Facility-Administered Medications on File Prior to Visit  Medication Dose Route Frequency Provider Last Rate Last Dose  . opium-belladonna (B&O SUPPRETTES) suppository 1 suppository  1 suppository Rectal Once Carolan Clines, MD       The PMH, PSH, Social History, Family History, Medications, and allergies have been reviewed in [facility], and have been updated if relevant.   Review of Systems    See HPI Review of Systems  Constitutional: Negative.    HENT: Negative.   Eyes: Negative.   Respiratory: Negative.   Cardiovascular: Negative.   Gastrointestinal: Negative.   Genitourinary: Negative.   Musculoskeletal: Negative.   Skin: Negative.   Allergic/Immunologic: Negative.   Neurological: Negative.   Hematological: Negative.   Psychiatric/Behavioral: Negative.       Objective:   Physical Exam BP 122/68 mmHg  Pulse 53  Temp(Src) 98.1 F (36.7 C) (Oral)  Ht 5' 4.25" (1.632 m)  Wt 154 lb 12 oz (70.194 kg)  BMI 26.35 kg/m2  SpO2 98%   .General:  Pleasant male in NAD Eyes:  PERRL Ears:  External ear exam shows no significant lesions or deformities.  Otoscopic examination reveals clear canals, tympanic membranes are intact bilaterally without bulging, retraction, inflammation or discharge. Hearing is grossly normal bilaterally. Nose:  External nasal examination shows no deformity or inflammation. Nasal mucosa are pink and moist without lesions or exudates. Mouth:  Oral mucosa and oropharynx without lesions or exudates.  Teeth in good repair. Neck:  no carotid bruit or thyromegaly no cervical or supraclavicular lymphadenopathy  Lungs:  Normal respiratory effort, chest expands symmetrically. Lungs are clear to auscultation, no crackles or wheezes. Heart:  Normal rate and regular rhythm. S1 and S2 normal without gallop, murmur, click, rub or other extra sounds. Abdomen:  Bowel sounds positive,abdomen soft and non-tender without masses, organomegaly or hernias noted. Prostate:  Deferred- recently followed by urology Pulses:  R and L posterior tibial pulses are full and equal bilaterally  Extremities:  no edema      Assessment & Plan:

## 2015-01-14 ENCOUNTER — Ambulatory Visit: Payer: Self-pay | Admitting: Surgery

## 2015-02-12 ENCOUNTER — Encounter (HOSPITAL_BASED_OUTPATIENT_CLINIC_OR_DEPARTMENT_OTHER): Payer: Self-pay

## 2015-02-13 ENCOUNTER — Encounter (HOSPITAL_BASED_OUTPATIENT_CLINIC_OR_DEPARTMENT_OTHER)
Admission: RE | Admit: 2015-02-13 | Discharge: 2015-02-13 | Disposition: A | Discharge status: Home / Self Care | Payer: 59 | Source: Ambulatory Visit | Attending: Surgery | Admitting: Surgery

## 2015-02-13 DIAGNOSIS — Z79899 Other long term (current) drug therapy: Secondary | ICD-10-CM | POA: Diagnosis not present

## 2015-02-13 DIAGNOSIS — E780 Pure hypercholesterolemia: Secondary | ICD-10-CM | POA: Diagnosis not present

## 2015-02-13 DIAGNOSIS — K4090 Unilateral inguinal hernia, without obstruction or gangrene, not specified as recurrent: Secondary | ICD-10-CM | POA: Diagnosis not present

## 2015-02-13 DIAGNOSIS — I10 Essential (primary) hypertension: Secondary | ICD-10-CM | POA: Diagnosis not present

## 2015-02-13 DIAGNOSIS — R001 Bradycardia, unspecified: Secondary | ICD-10-CM | POA: Diagnosis not present

## 2015-02-13 LAB — CBC WITH DIFFERENTIAL/PLATELET
BASOS ABS: 0 10*3/uL (ref 0.0–0.1)
Basophils Relative: 0 % (ref 0–1)
Eosinophils Absolute: 0.4 10*3/uL (ref 0.0–0.7)
Eosinophils Relative: 5 % (ref 0–5)
HEMATOCRIT: 43.1 % (ref 39.0–52.0)
Hemoglobin: 14.6 g/dL (ref 13.0–17.0)
Lymphocytes Relative: 25 % (ref 12–46)
Lymphs Abs: 1.6 10*3/uL (ref 0.7–4.0)
MCH: 30.4 pg (ref 26.0–34.0)
MCHC: 33.9 g/dL (ref 30.0–36.0)
MCV: 89.6 fL (ref 78.0–100.0)
MONOS PCT: 13 % — AB (ref 3–12)
Monocytes Absolute: 0.9 10*3/uL (ref 0.1–1.0)
Neutro Abs: 3.7 10*3/uL (ref 1.7–7.7)
Neutrophils Relative %: 57 % (ref 43–77)
Platelets: 179 10*3/uL (ref 150–400)
RBC: 4.81 MIL/uL (ref 4.22–5.81)
RDW: 13.5 % (ref 11.5–15.5)
WBC: 6.5 10*3/uL (ref 4.0–10.5)

## 2015-02-13 LAB — COMPREHENSIVE METABOLIC PANEL
ALBUMIN: 4.1 g/dL (ref 3.5–5.0)
ALT: 14 U/L — AB (ref 17–63)
AST: 23 U/L (ref 15–41)
Alkaline Phosphatase: 55 U/L (ref 38–126)
Anion gap: 9 (ref 5–15)
BILIRUBIN TOTAL: 0.7 mg/dL (ref 0.3–1.2)
BUN: 22 mg/dL — AB (ref 6–20)
CO2: 23 mmol/L (ref 22–32)
Calcium: 9.2 mg/dL (ref 8.9–10.3)
Chloride: 103 mmol/L (ref 101–111)
Creatinine, Ser: 1.22 mg/dL (ref 0.61–1.24)
GFR calc Af Amer: >60 mL/min (ref >60)
GFR calc non Af Amer: 59 mL/min — ABNORMAL LOW (ref >60)
GLUCOSE: 146 mg/dL — AB (ref 65–99)
POTASSIUM: 4.9 mmol/L (ref 3.5–5.1)
Sodium: 135 mmol/L (ref 135–145)
Total Protein: 6.7 g/dL (ref 6.5–8.1)

## 2015-02-14 ENCOUNTER — Ambulatory Visit (HOSPITAL_BASED_OUTPATIENT_CLINIC_OR_DEPARTMENT_OTHER): Payer: 59 | Admitting: Anesthesiology

## 2015-02-14 ENCOUNTER — Encounter (HOSPITAL_BASED_OUTPATIENT_CLINIC_OR_DEPARTMENT_OTHER): Payer: Self-pay

## 2015-02-14 ENCOUNTER — Encounter (HOSPITAL_BASED_OUTPATIENT_CLINIC_OR_DEPARTMENT_OTHER)
Admission: RE | Disposition: A | Discharge status: Home / Self Care | Payer: Self-pay | Source: Ambulatory Visit | Attending: Surgery

## 2015-02-14 ENCOUNTER — Ambulatory Visit (HOSPITAL_BASED_OUTPATIENT_CLINIC_OR_DEPARTMENT_OTHER)
Admission: RE | Admit: 2015-02-14 | Discharge: 2015-02-14 | Disposition: A | Discharge status: Home / Self Care | Payer: 59 | Source: Ambulatory Visit | Attending: Surgery | Admitting: Surgery

## 2015-02-14 DIAGNOSIS — K4090 Unilateral inguinal hernia, without obstruction or gangrene, not specified as recurrent: Secondary | ICD-10-CM | POA: Diagnosis not present

## 2015-02-14 DIAGNOSIS — Z79899 Other long term (current) drug therapy: Secondary | ICD-10-CM | POA: Insufficient documentation

## 2015-02-14 DIAGNOSIS — I10 Essential (primary) hypertension: Secondary | ICD-10-CM | POA: Insufficient documentation

## 2015-02-14 DIAGNOSIS — E780 Pure hypercholesterolemia: Secondary | ICD-10-CM | POA: Insufficient documentation

## 2015-02-14 DIAGNOSIS — R001 Bradycardia, unspecified: Secondary | ICD-10-CM | POA: Insufficient documentation

## 2015-02-14 HISTORY — PX: INGUINAL HERNIA REPAIR: SHX194

## 2015-02-14 HISTORY — DX: Chronic kidney disease, unspecified: N18.9

## 2015-02-14 HISTORY — DX: Hypothyroidism, unspecified: E03.9

## 2015-02-14 HISTORY — PX: INSERTION OF MESH: SHX5868

## 2015-02-14 SURGERY — REPAIR, HERNIA, INGUINAL, ADULT
Anesthesia: General | Laterality: Right

## 2015-02-14 MED ORDER — MIDAZOLAM HCL 2 MG/2ML IJ SOLN
1.0000 mg | INTRAMUSCULAR | Status: DC | PRN
  Administered 2015-02-14: 2 mg via INTRAVENOUS

## 2015-02-14 MED ORDER — ONDANSETRON HCL 4 MG/2ML IJ SOLN
INTRAMUSCULAR | Status: DC | PRN
  Administered 2015-02-14: 4 mg via INTRAVENOUS

## 2015-02-14 MED ORDER — DEXAMETHASONE SODIUM PHOSPHATE 4 MG/ML IJ SOLN
INTRAMUSCULAR | Status: DC | PRN
  Administered 2015-02-14: 10 mg via INTRAVENOUS

## 2015-02-14 MED ORDER — PROPOFOL 500 MG/50ML IV EMUL
INTRAVENOUS | Status: AC
  Filled 2015-02-14: qty 50

## 2015-02-14 MED ORDER — ROCURONIUM BROMIDE 100 MG/10ML IV SOLN
INTRAVENOUS | Status: DC | PRN
  Administered 2015-02-14: 40 mg via INTRAVENOUS

## 2015-02-14 MED ORDER — NEOSTIGMINE METHYLSULFATE 10 MG/10ML IV SOLN
INTRAVENOUS | Status: DC | PRN
  Administered 2015-02-14: 4 mg via INTRAVENOUS

## 2015-02-14 MED ORDER — LIDOCAINE HCL (CARDIAC) 20 MG/ML IV SOLN
INTRAVENOUS | Status: DC | PRN
  Administered 2015-02-14: 50 mg via INTRAVENOUS

## 2015-02-14 MED ORDER — PHENYLEPHRINE HCL 10 MG/ML IJ SOLN
INTRAMUSCULAR | Status: DC | PRN
  Administered 2015-02-14: 80 ug via INTRAVENOUS
  Administered 2015-02-14 (×2): 40 ug via INTRAVENOUS

## 2015-02-14 MED ORDER — LACTATED RINGERS IV SOLN
INTRAVENOUS | Status: DC
  Administered 2015-02-14 (×3): via INTRAVENOUS

## 2015-02-14 MED ORDER — MIDAZOLAM HCL 2 MG/2ML IJ SOLN
INTRAMUSCULAR | Status: AC
  Filled 2015-02-14: qty 2

## 2015-02-14 MED ORDER — BUPIVACAINE-EPINEPHRINE 0.25% -1:200000 IJ SOLN
INTRAMUSCULAR | Status: DC | PRN
  Administered 2015-02-14: 6 mL

## 2015-02-14 MED ORDER — CHLORHEXIDINE GLUCONATE 4 % EX LIQD: 1.0000 "application " | Freq: Once | CUTANEOUS | Status: DC

## 2015-02-14 MED ORDER — FENTANYL CITRATE (PF) 100 MCG/2ML IJ SOLN
50.0000 ug | INTRAMUSCULAR | Status: DC | PRN
Start: 2015-02-14 — End: 2015-02-14
  Administered 2015-02-14: 100 ug via INTRAVENOUS

## 2015-02-14 MED ORDER — FENTANYL CITRATE (PF) 100 MCG/2ML IJ SOLN
INTRAMUSCULAR | Status: AC
  Filled 2015-02-14: qty 2

## 2015-02-14 MED ORDER — HEPARIN SOD (PORK) LOCK FLUSH 100 UNIT/ML IV SOLN
INTRAVENOUS | Status: AC
  Filled 2015-02-14: qty 5

## 2015-02-14 MED ORDER — BUPIVACAINE-EPINEPHRINE (PF) 0.25% -1:200000 IJ SOLN
INTRAMUSCULAR | Status: AC
  Filled 2015-02-14: qty 120

## 2015-02-14 MED ORDER — FENTANYL CITRATE (PF) 100 MCG/2ML IJ SOLN
INTRAMUSCULAR | Status: DC | PRN
  Administered 2015-02-14 (×2): 50 ug via INTRAVENOUS

## 2015-02-14 MED ORDER — GLYCOPYRROLATE 0.2 MG/ML IJ SOLN
0.2000 mg | Freq: Once | INTRAMUSCULAR | Status: AC | PRN
  Administered 2015-02-14: 0.6 mg via INTRAVENOUS

## 2015-02-14 MED ORDER — SCOPOLAMINE 1 MG/3DAYS TD PT72: 1.0000 | MEDICATED_PATCH | Freq: Once | TRANSDERMAL | Status: DC | PRN

## 2015-02-14 MED ORDER — OXYCODONE-ACETAMINOPHEN 5-325 MG PO TABS: 1.0000 | ORAL_TABLET | ORAL | Status: DC | PRN

## 2015-02-14 MED ORDER — PROPOFOL 10 MG/ML IV BOLUS
INTRAVENOUS | Status: AC
  Filled 2015-02-14: qty 80

## 2015-02-14 MED ORDER — HEPARIN (PORCINE) IN NACL 2-0.9 UNIT/ML-% IJ SOLN
INTRAMUSCULAR | Status: AC
Start: 2015-02-14 — End: 2015-02-14
  Filled 2015-02-14: qty 500

## 2015-02-14 MED ORDER — EPHEDRINE SULFATE 50 MG/ML IJ SOLN
INTRAMUSCULAR | Status: DC | PRN
  Administered 2015-02-14: 10 mg via INTRAVENOUS

## 2015-02-14 MED ORDER — PROPOFOL 10 MG/ML IV BOLUS
INTRAVENOUS | Status: DC | PRN
  Administered 2015-02-14: 200 mg via INTRAVENOUS

## 2015-02-14 MED ORDER — CEFAZOLIN SODIUM-DEXTROSE 2-3 GM-% IV SOLR
INTRAVENOUS | Status: AC
  Filled 2015-02-14: qty 50

## 2015-02-14 MED ORDER — CEFAZOLIN SODIUM-DEXTROSE 2-3 GM-% IV SOLR
2.0000 g | INTRAVENOUS | Status: AC
  Administered 2015-02-14: 2 g via INTRAVENOUS

## 2015-02-14 MED ORDER — MIDAZOLAM HCL 5 MG/5ML IJ SOLN
INTRAMUSCULAR | Status: DC | PRN
  Administered 2015-02-14: 2 mg via INTRAVENOUS

## 2015-02-14 MED ORDER — FENTANYL CITRATE (PF) 100 MCG/2ML IJ SOLN
INTRAMUSCULAR | Status: AC
  Filled 2015-02-14: qty 6

## 2015-02-14 SURGICAL SUPPLY — 49 items
BLADE CLIPPER SURG (BLADE) ×2 IMPLANT
BLADE SURG 15 STRL LF DISP TIS (BLADE) ×1 IMPLANT
BLADE SURG 15 STRL SS (BLADE) ×2
CANISTER SUCT 1200ML W/VALVE (MISCELLANEOUS) IMPLANT
CHLORAPREP W/TINT 26ML (MISCELLANEOUS) ×2 IMPLANT
COVER BACK TABLE 60X90IN (DRAPES) ×2 IMPLANT
COVER MAYO STAND STRL (DRAPES) ×2 IMPLANT
DECANTER SPIKE VIAL GLASS SM (MISCELLANEOUS) IMPLANT
DRAIN PENROSE 1/2X12 LTX STRL (WOUND CARE) ×2 IMPLANT
DRAPE LAPAROTOMY TRNSV 102X78 (DRAPE) ×2 IMPLANT
DRAPE UTILITY XL STRL (DRAPES) ×2 IMPLANT
ELECT COATED BLADE 2.86 ST (ELECTRODE) ×2 IMPLANT
ELECT REM PT RETURN 9FT ADLT (ELECTROSURGICAL) ×2
ELECTRODE REM PT RTRN 9FT ADLT (ELECTROSURGICAL) ×1 IMPLANT
GAUZE SPONGE 4X4 16PLY XRAY LF (GAUZE/BANDAGES/DRESSINGS) IMPLANT
GLOVE BIOGEL PI IND STRL 7.0 (GLOVE) IMPLANT
GLOVE BIOGEL PI IND STRL 7.5 (GLOVE) IMPLANT
GLOVE BIOGEL PI IND STRL 8 (GLOVE) ×1 IMPLANT
GLOVE BIOGEL PI INDICATOR 7.0 (GLOVE) ×1
GLOVE BIOGEL PI INDICATOR 7.5 (GLOVE) ×1
GLOVE BIOGEL PI INDICATOR 8 (GLOVE) ×1
GLOVE ECLIPSE 8.0 STRL XLNG CF (GLOVE) ×2 IMPLANT
GLOVE SURG SS PI 7.0 STRL IVOR (GLOVE) ×2 IMPLANT
GOWN STRL REUS W/ TWL LRG LVL3 (GOWN DISPOSABLE) ×3 IMPLANT
GOWN STRL REUS W/TWL LRG LVL3 (GOWN DISPOSABLE) ×6
LIQUID BAND (GAUZE/BANDAGES/DRESSINGS) ×2 IMPLANT
MESH HERNIA SYS ULTRAPRO LRG (Mesh General) ×1 IMPLANT
NEEDLE HYPO 25X1 1.5 SAFETY (NEEDLE) ×2 IMPLANT
NS IRRIG 1000ML POUR BTL (IV SOLUTION) ×1 IMPLANT
PACK BASIN DAY SURGERY FS (CUSTOM PROCEDURE TRAY) ×2 IMPLANT
PENCIL BUTTON HOLSTER BLD 10FT (ELECTRODE) ×2 IMPLANT
SLEEVE SCD COMPRESS KNEE MED (MISCELLANEOUS) ×2 IMPLANT
SPONGE GAUZE 4X4 12PLY STER LF (GAUZE/BANDAGES/DRESSINGS) IMPLANT
SPONGE LAP 4X18 X RAY DECT (DISPOSABLE) ×2 IMPLANT
SUT MON AB 4-0 PC3 18 (SUTURE) ×2 IMPLANT
SUT NOVA 0 T19/GS 22DT (SUTURE) ×4 IMPLANT
SUT VIC AB 0 SH 27 (SUTURE) ×2 IMPLANT
SUT VIC AB 2-0 SH 27 (SUTURE) ×2
SUT VIC AB 2-0 SH 27XBRD (SUTURE) ×1 IMPLANT
SUT VIC AB 3-0 54X BRD REEL (SUTURE) IMPLANT
SUT VIC AB 3-0 BRD 54 (SUTURE)
SUT VICRYL 3-0 CR8 SH (SUTURE) ×2 IMPLANT
SUT VICRYL AB 2 0 TIE (SUTURE) IMPLANT
SUT VICRYL AB 2 0 TIES (SUTURE)
SYR CONTROL 10ML LL (SYRINGE) ×2 IMPLANT
TOWEL OR 17X24 6PK STRL BLUE (TOWEL DISPOSABLE) ×2 IMPLANT
TOWEL OR NON WOVEN STRL DISP B (DISPOSABLE) ×2 IMPLANT
TUBE CONNECTING 20X1/4 (TUBING) ×1 IMPLANT
YANKAUER SUCT BULB TIP NO VENT (SUCTIONS) ×1 IMPLANT

## 2015-02-14 NOTE — Anesthesia Procedure Notes (Addendum)
Procedure Name: Intubation Date/Time: 02/14/2015 7:46 AM Performed by: Marrianne Mood Pre-anesthesia Checklist: Patient identified, Emergency Drugs available, Suction available, Patient being monitored and Timeout performed Patient Re-evaluated:Patient Re-evaluated prior to inductionOxygen Delivery Method: Circle System Utilized Preoxygenation: Pre-oxygenation with 100% oxygen Intubation Type: IV induction Ventilation: Mask ventilation without difficulty Grade View: Grade III Tube type: Oral Tube size: 8.0 mm Number of attempts: 2 Airway Equipment and Method: Stylet and Oral airway Placement Confirmation: ETT inserted through vocal cords under direct vision,  positive ETCO2 and breath sounds checked- equal and bilateral Secured at: 21 cm Tube secured with: Tape Dental Injury: Teeth and Oropharynx as per pre-operative assessment  Difficulty Due To: Difficult Airway- due to limited oral opening, Difficult Airway- due to dentition and Difficult Airway- due to reduced neck mobility Comments: Glidescope used X one attempt.  Easy mask airway.  Direct laryngoscopy X one without intubation attempt.    Anesthesia Regional Block:  TAP block  Pre-Anesthetic Checklist: ,, timeout performed, Correct Patient, Correct Site, Correct Laterality, Correct Procedure, Correct Position, site marked, Risks and benefits discussed,  Surgical consent,  Pre-op evaluation,  At surgeon's request and post-op pain management  Laterality: Right  Prep: chloraprep       Needles:  Injection technique: Single-shot  Needle Type: Echogenic Stimulator Needle     Needle Length: 9cm 9 cm Needle Gauge: 22 and 22 G    Additional Needles:  Procedures: ultrasound guided (picture in chart) TAP block Narrative:  Start time: 02/14/2015 7:20 AM End time: 02/14/2015 7:25 AM Injection made incrementally with aspirations every 5 mL.  Performed by: Personally   Additional Notes: 30 cc 0.5% Marcaine 1:200 epi injected  easily

## 2015-02-14 NOTE — Interval H&P Note (Signed)
History and Physical Interval Note:  02/14/2015 7:26 AM  Richard Ibarra  has presented today for surgery, with the diagnosis of Right Inguinal Hernia   The various methods of treatment have been discussed with the patient and family. After consideration of risks, benefits and other options for treatment, the patient has consented to  Procedure(s): RIGHT INGUINAL HERNIA REPAIR WITH MESH (Right) INSERTION OF MESH (Right) as a surgical intervention .  The patient's history has been reviewed, patient examined, no change in status, stable for surgery.  I have reviewed the patient's chart and labs.  Questions were answered to the patient's satisfaction.     Jannette Cotham A.

## 2015-02-14 NOTE — Discharge Instructions (Signed)
[facility] _______[facility] ° °UMBILICAL OR INGUINAL HERNIA REPAIR: POST OP INSTRUCTIONS ° °Always review your discharge instruction sheet given to you by the facility where your surgery was performed. °IF YOU HAVE DISABILITY OR FAMILY LEAVE FORMS, YOU MUST BRING THEM TO THE OFFICE FOR PROCESSING.   °DO NOT GIVE THEM TO YOUR DOCTOR. ° °1. A  prescription for pain medication may be given to you upon discharge.  Take your pain medication as prescribed, if needed.  If narcotic pain medicine is not needed, then you may take acetaminophen (Tylenol) or ibuprofen (Advil) as needed. °2. Take your usually prescribed medications unless otherwise directed. °3. If you need a refill on your pain medication, please contact your pharmacy.  They will contact our office to request authorization. Prescriptions will not be filled after 5 pm or on week-ends. °4. You should follow a light diet the first 24 hours after arrival home, such as soup and crackers, etc.  Be sure to include lots of fluids daily.  Resume your normal diet the day after surgery. °5. Most patients will experience some swelling and bruising around the umbilicus or in the groin and scrotum.  Ice packs and reclining will help.  Swelling and bruising can take several days to resolve.  °6. It is common to experience some constipation if taking pain medication after surgery.  Increasing fluid intake and taking a stool softener (such as Colace) will usually help or prevent this problem from occurring.  A mild laxative (Milk of Magnesia or Miralax) should be taken according to package directions if there are no bowel movements after 48 hours. °7. Unless discharge instructions indicate otherwise, you may remove your bandages 24-48 hours after surgery, and you may shower at that time.  You may have steri-strips (small skin tapes) in place directly over the incision.  These strips should be left on the skin for 7-10 days.  If your surgeon used skin glue on the  incision, you may shower in 24 hours.  The glue will flake off over the next 2-3 weeks.  Any sutures or staples will be removed at the office during your follow-up visit. °8. ACTIVITIES:  You may resume regular (light) daily activities beginning the next day--such as daily self-care, walking, climbing stairs--gradually increasing activities as tolerated.  You may have sexual intercourse when it is comfortable.  Refrain from any heavy lifting or straining until approved by your doctor. °a. You may drive when you are no longer taking prescription pain medication, you can comfortably wear a seatbelt, and you can safely maneuver your car and apply brakes. °b. RETURN TO WORK:  __________________________________________________________ °9. You should see your doctor in the office for a follow-up appointment approximately 2-3 weeks after your surgery.  Make sure that you call for this appointment within a day or two after you arrive home to insure a convenient appointment time. °10. OTHER INSTRUCTIONS:  __________________________________________________________________________________________________________________________________________________________________________________________  °WHEN TO CALL YOUR DOCTOR: °1. Fever over 101.0 °2. Inability to urinate °3. Nausea and/or vomiting °4. Extreme swelling or bruising °5. Continued bleeding from incision. °6. Increased pain, redness, or drainage from the incision ° °The clinic staff is available to answer your questions during regular business hours.  Please don’t hesitate to call and ask to speak to one of the nurses for clinical concerns.  If you have a medical emergency, go to the nearest emergency room or call 911.  A surgeon from [facility] is always on call at the hospital ° ° °  [address] [place], [place], [place]  [address] ?  [address], [place], [place]   [address] [phone] ? [phone] ? FAX [phone] Web site:  [email or web address]   Post Anesthesia Home Care Instructions  Activity: Get plenty of rest for the remainder of the day. A responsible adult should stay with you for 24 hours following the procedure.  For the next 24 hours, DO NOT: -Drive a car -Paediatric nurse -Drink alcoholic beverages -Take any medication unless instructed by your physician -Make any legal decisions or sign important papers.  Meals: Start with liquid foods such as gelatin or soup. Progress to regular foods as tolerated. Avoid greasy, spicy, heavy foods. If nausea and/or vomiting occur, drink only clear liquids until the nausea and/or vomiting subsides. Call your physician if vomiting continues.  Special Instructions/Symptoms: Your throat may feel dry or sore from the anesthesia or the breathing tube placed in your throat during surgery. If this causes discomfort, gargle with warm salt water. The discomfort should disappear within 24 hours.  If you had a scopolamine patch placed behind your ear for the management of post- operative nausea and/or vomiting:  1. The medication in the patch is effective for 72 hours, after which it should be removed.  Wrap patch in a tissue and discard in the trash. Wash hands thoroughly with soap and water. 2. You may remove the patch earlier than 72 hours if you experience unpleasant side effects which may include dry mouth, dizziness or visual disturbances. 3. Avoid touching the patch. Wash your hands with soap and water after contact with the patch.    Regional Anesthesia Blocks  1. Numbness or the inability to move the "blocked" extremity may last from 3-48 hours after placement. The length of time depends on the medication injected and your individual response to the medication. If the numbness is not going away after 48 hours, call your surgeon.  2. The extremity that is blocked will need to be protected until the numbness is gone and the  Strength has returned.  Because you cannot feel it, you will need to take extra care to avoid injury. Because it may be weak, you may have difficulty moving it or using it. You may not know what position it is in without looking at it while the block is in effect.  3. For blocks in the legs and feet, returning to weight bearing and walking needs to be done carefully. You will need to wait until the numbness is entirely gone and the strength has returned. You should be able to move your leg and foot normally before you try and bear weight or walk. You will need someone to be with you when you first try to ensure you do not fall and possibly risk injury.  4. Bruising and tenderness at the needle site are common side effects and will resolve in a few days.  5. Persistent numbness or new problems with movement should be communicated to the surgeon or the [facility] [phone] [place] [phone]).

## 2015-02-14 NOTE — H&P (Signed)
Richard Ibarra. Richard Ibarra 01/14/2015 9:27 AM Location: [facility] Patient #: [id number] DOB: 04/20/1946 Married / Language: English / Race: White Male  History of Present Illness Richard Moores A. Dequann Vandervelden MD; 01/14/2015 10:46 AM) Patient words: eval RIH.  The patient is a 69 year old male who presents with an inguinal hernia. The hernia(s) is/are located on the right side. Symptoms include inguinal bulge and inguinal pain. The pain is located in the right inguinal area. There is no radiation. The patient describes the pain as dull. Onset was gradual 6 month(s) ago. The patient describes this as mild.   Other Problems Ventura Sellers, CMA; 01/14/2015 9:27 AM) High blood pressure Hypercholesterolemia Inguinal Hernia  Past Surgical History Ventura Sellers, CMA; 01/14/2015 9:27 AM) No pertinent past surgical history  Allergies Ventura Sellers, [place]; 01/14/2015 9:28 AM) Banana Concentrate *PHARMACEUTICAL ADJUVANTS*  Medication History Ventura Sellers, CMA; 01/14/2015 9:29 AM) Benicar (20MG  Tablet, Oral) Active. Crestor (10MG  Tablet, Oral) Active. Niaspan (1000MG  Tablet ER, Oral) Active. Metoprolol Tartrate (50MG  Tablet, Oral) Active. Levothyroxine Sodium (112MCG Tablet, Oral) Active. Finasteride (5MG  Tablet, Oral) Active. Fexofenadine HCl (180MG  Tablet, Oral) Active.  Social History Ventura Sellers, [place]; 01/14/2015 9:27 AM) Alcohol use Occasional alcohol use. Caffeine use Tea. No drug use Tobacco use Never smoker.  Family History Ventura Sellers, [place]; 01/14/2015 9:27 AM) Bleeding disorder Mother. Cerebrovascular Accident Father. Kidney Disease Brother.  Review of Systems ([place]. Brooks CMA; 01/14/2015 9:27 AM) General Not Present- Appetite Loss, Chills, Fatigue, Fever, Night Sweats, Weight Gain and Weight Loss. Skin Not Present- Change in Wart/Mole, Dryness, Hives, Jaundice, New Lesions, Non-Healing Wounds, Rash and Ulcer. HEENT Present- Seasonal  Allergies. Not Present- Earache, Hearing Loss, Hoarseness, Nose Bleed, Oral Ulcers, Ringing in the Ears, Sinus Pain, Sore Throat, Visual Disturbances, Wears glasses/contact lenses and Yellow Eyes. Respiratory Not Present- Bloody sputum, Chronic Cough, Difficulty Breathing, Snoring and Wheezing. Breast Not Present- Breast Mass, Breast Pain, Nipple Discharge and Skin Changes. Cardiovascular Not Present- Chest Pain, Difficulty Breathing Lying Down, Leg Cramps, Palpitations, Rapid Heart Rate, Shortness of Breath and Swelling of Extremities. Gastrointestinal Not Present- Abdominal Pain, Bloating, Bloody Stool, Change in Bowel Habits, Chronic diarrhea, Constipation, Difficulty Swallowing, Excessive gas, Gets full quickly at meals, Hemorrhoids, Indigestion, Nausea, Rectal Pain and Vomiting. Male Genitourinary Not Present- Blood in Urine, Change in Urinary Stream, Frequency, Impotence, Nocturia, Painful Urination, Urgency and Urine Leakage. Musculoskeletal Not Present- Back Pain, Joint Pain, Joint Stiffness, Muscle Pain, Muscle Weakness and Swelling of Extremities. Neurological Not Present- Decreased Memory, Fainting, Headaches, Numbness, Seizures, Tingling, Tremor, Trouble walking and Weakness. Psychiatric Not Present- Anxiety, Bipolar, Change in Sleep Pattern, Depression, Fearful and Frequent crying. Endocrine Not Present- Cold Intolerance, Excessive Hunger, Hair Changes, Heat Intolerance, Hot flashes and New Diabetes. Hematology Not Present- Easy Bruising, Excessive bleeding, Gland problems, HIV and Persistent Infections.   Vitals [employer] R. Brooks CMA; 01/14/2015 9:27 AM) 01/14/2015 9:27 AM Weight: 155.5 lb Height: 65in Body Surface Area: 1.8 m Body Mass Index: 25.88 kg/m BP: 112/72 (Sitting, Left Arm, Standard)    Physical Exam (Kailen Hinkle A. Aiko Belko MD; 01/14/2015 10:46 AM) General Mental Status-Alert. General Appearance-Consistent with stated age. Hydration-Well  hydrated. Voice-Normal.  Head and Neck Head-normocephalic, atraumatic with no lesions or palpable masses. Trachea-midline.  Eye Eyeball - Bilateral-Extraocular movements intact. Sclera/Conjunctiva - Bilateral-No scleral icterus.  Chest and Lung Exam Chest and lung exam reveals -quiet, even and easy respiratory effort with no use of accessory muscles and on auscultation, normal breath sounds, no adventitious sounds and  normal vocal resonance. Inspection Chest Wall - Normal. Back - normal.  Cardiovascular Cardiovascular examination reveals -normal heart sounds, regular rate and rhythm with no murmurs and normal pedal pulses bilaterally.  Abdomen Inspection Skin - Scar - no surgical scars. Hernias - Inguinal hernia - Right - Reducible. Palpation/Percussion Palpation and Percussion of the abdomen reveal - Soft, Non Tender, No Rebound tenderness, No Rigidity (guarding) and No hepatosplenomegaly. Auscultation Auscultation of the abdomen reveals - Bowel sounds normal.  Neurologic Neurologic evaluation reveals -alert and oriented x 3 with no impairment of recent or remote memory. Mental Status-Normal.  Musculoskeletal Normal Exam - Left-Upper Extremity Strength Normal and Lower Extremity Strength Normal. Normal Exam - Right-Upper Extremity Strength Normal, Lower Extremity Weakness.    Assessment & Plan (Lillian Ballester A. Florette Thai MD; 01/14/2015 10:00 AM) RIGHT INGUINAL HERNIA (550.90  K40.90) Impression: DISCUSSED OPEN AND LAPAROSCOPIC REPAIR WITH MESH AND THE PRO AND CONS OF EACH. HE WOULD LIKE TO PROCEED WITH OPEN RIH WITH MESH. The risk of hernia repair include bleeding, infection, organ injury, bowel injury, bladder injury, nerve injury recurrent hernia, blood clots, worsening of underlying condition, chronic pain, mesh use, open surgery, death, and the need for other operattions. Pt agrees to proceed Current Plans  The anatomy and the physiology was discussed. The  pathophysiology and natural history of the disease was discussed. Options were discussed and recommendations were made. Technique, risks, benefits, & alternatives were discussed. Risks such as stroke, heart attack, bleeding, indection, death, and other risks discussed. Questions answered. The patient agrees to proceed. Pt Education - [facility] Umbilical/ Inguinal Hernia HCI

## 2015-02-14 NOTE — Op Note (Signed)
Right Inguinal Hernia repair with mesh, Open, Procedure Note  Indications: The patient presented with a history of a right, reducible  inguinalhernia. The risk of hernia repair include bleeding,  Infection,   Recurrence of the hernia,  Mesh use, chronic pain,  Organ injury,  Bowel injury,  Bladder injury,   nerve injury with numbness around the incision,  Death,  and worsening of preexisting  medical problems.  The alternatives to surgery have been discussed as well..  Long term expectations of both operative and non operative treatments have been discussed.   The patient agrees to proceed.   Pre-operative Diagnosis: right reducible inguinal hernia  Post-operative Diagnosis: same  Surgeon: Erroll Luna A.   Assistants: OR staff  Anesthesia: General endotracheal anesthesia, Local anesthesia 0.25.% bupivacaine, with epinephrine and TAP block   ASA Class: 2  Procedure Details  The patient was seen again in the Holding Room. The risks, benefits, complications, treatment options, and expected outcomes were discussed with the patient. The possibilities of reaction to medication, pulmonary aspiration, perforation of viscus, bleeding, recurrent infection, the need for additional procedures, and development of a complication requiring transfusion or further operation were discussed with the patient and/or family. There was concurrence with the proposed plan, and informed consent was obtained. The site of surgery was properly noted/marked. The patient was taken to the Operating Room, identified as Richard Ibarra, and the procedure verified as hernia repair. A Time Out was held and the above information confirmed.  The patient was placed in the supine position and underwent induction of anesthesia, the lower abdomen and groin was prepped and draped in the standard fashion, and 0.25% Marcaine with epinephrine was used to anesthetize the skin over the mid-portion of the  Right inguinal canal. A transverse  incision was made. Dissection was carried through the soft tissue to expose the inguinal canal and inguinal ligament along its lower edge. The external oblique fascia was split along the course of its fibers, exposing the inguinal canal. The cord and nerve were looped using a Penrose drain and reflected out of the field. The defect was exposed and a piece of prolene hernia system ultrapro mesh was and placed into the defect and deployed . Interupted 2-0 novafil suture was then used  to repair the defect, with the suture being sewn from the pubic tubercle inferiorly and superiorly along the canal to a level just beyond the internal ring. The mesh was split to allow passage of the cord and nerve into the canal without entrapment. The contents were then returned to canal and the external oblique fashion was then closed in a continuous fashion using 3-0 Vicryl suture taking care not to cause entrapment. Scarpa's layer closed with 3 0 vicryl and 4 0 monocryl used to close the skin.  Dermabond used for dressing.  Instrument, sponge, and needle counts were correct prior to closure and at the conclusion of the case.  Findings: Hernia as above  Estimated Blood Loss: Minimal         Drains: None         Total IV Fluids: 400 mL         Specimens: none                Complications: None; patient tolerated the procedure well.         Disposition: PACU - hemodynamically stable.         Condition: stable

## 2015-02-14 NOTE — Transfer of Care (Signed)
Immediate Anesthesia Transfer of Care Note  Patient: Richard Ibarra  Procedure(s) Performed: Procedure(s): RIGHT INGUINAL HERNIA REPAIR WITH MESH (Right) INSERTION OF MESH (Right)  Patient Location: PACU  Anesthesia Type:GA combined with regional for post-op pain  Level of Consciousness: sedated and patient cooperative  Airway & Oxygen Therapy: Patient Spontanous Breathing and Patient connected to face mask oxygen  Post-op Assessment: Report given to RN and Post -op Vital signs reviewed and stable  Post vital signs: Reviewed and stable  Last Vitals:  Filed Vitals:   02/14/15 0735  BP:   Pulse: 74  Temp:   Resp:     Complications: No apparent anesthesia complications

## 2015-02-14 NOTE — Anesthesia Postprocedure Evaluation (Signed)
  Anesthesia Post-op Note  Patient: Richard Ibarra  Procedure(s) Performed: Procedure(s): RIGHT INGUINAL HERNIA REPAIR WITH MESH (Right) INSERTION OF MESH (Right)  Patient Location: PACU  Anesthesia Type:General and GA combined with regional for post-op pain  Level of Consciousness: awake, alert  and oriented  Airway and Oxygen Therapy: Patient Spontanous Breathing and Patient connected to nasal cannula oxygen  Post-op Pain: mild  Post-op Assessment: Post-op Vital signs reviewed, Patient's Cardiovascular Status Stable, Respiratory Function Stable, Patent Airway and Pain level controlled              Post-op Vital Signs: stable  Last Vitals:  Filed Vitals:   02/14/15 0956  BP: 128/69  Pulse: 65  Temp: 36.4 C  Resp: 18    Complications: No apparent anesthesia complications

## 2015-02-14 NOTE — Anesthesia Preprocedure Evaluation (Signed)
Anesthesia Evaluation  Patient identified by MRN, date of birth, ID band Patient awake    Reviewed: Allergy & Precautions, NPO status   Airway Mallampati: II  TM Distance: >3 FB Neck ROM: Full    Dental  (+) Teeth Intact, Dental Advisory Given   Pulmonary  breath sounds clear to auscultation        Cardiovascular hypertension, Rhythm:Regular Rate:Normal     Neuro/Psych    GI/Hepatic   Endo/Other    Renal/GU      Musculoskeletal   Abdominal   Peds  Hematology   Anesthesia Other Findings   Reproductive/Obstetrics                             Anesthesia Physical Anesthesia Plan  ASA: II  Anesthesia Plan: General   Post-op Pain Management:    Induction: Intravenous  Airway Management Planned: LMA  Additional Equipment:   Intra-op Plan:   Post-operative Plan:   Informed Consent: I have reviewed the patients History and Physical, chart, labs and discussed the procedure including the risks, benefits and alternatives for the proposed anesthesia with the patient or authorized representative who has indicated his/her understanding and acceptance.   Dental advisory given  Plan Discussed with: CRNA and Anesthesiologist  Anesthesia Plan Comments:         Anesthesia Quick Evaluation

## 2015-02-15 ENCOUNTER — Encounter (HOSPITAL_BASED_OUTPATIENT_CLINIC_OR_DEPARTMENT_OTHER): Payer: Self-pay | Admitting: Surgery

## 2015-04-21 ENCOUNTER — Other Ambulatory Visit: Payer: Self-pay | Admitting: Family Medicine

## 2015-05-01 ENCOUNTER — Telehealth: Payer: Self-pay

## 2015-05-01 NOTE — Telephone Encounter (Signed)
Pt left v/m returning call; spoke with pt and he said had message from [employer] to cb; pt said he will contact [employer] and called [facility] in error.

## 2015-06-09 ENCOUNTER — Other Ambulatory Visit: Payer: Self-pay | Admitting: Family Medicine

## 2015-06-19 ENCOUNTER — Telehealth: Payer: Self-pay | Admitting: Family Medicine

## 2015-06-19 NOTE — Telephone Encounter (Signed)
Patient scheduled wellness exam on 12/28/15.  Patient uses [facility] and asked for lab order to be mailed to his home address.

## 2015-06-20 NOTE — Telephone Encounter (Signed)
Orders mailed to pts home as requested

## 2015-06-20 NOTE — Telephone Encounter (Signed)
Rx written and on my desk.

## 2015-08-18 ENCOUNTER — Other Ambulatory Visit: Payer: Self-pay | Admitting: Family Medicine

## 2015-11-22 ENCOUNTER — Other Ambulatory Visit: Payer: Self-pay | Admitting: Family Medicine

## 2015-11-23 LAB — CBC WITH DIFFERENTIAL/PLATELET
BASOS: 0 %
Basophils Absolute: 0 10*3/uL (ref 0.0–0.2)
EOS (ABSOLUTE): 0.4 10*3/uL (ref 0.0–0.4)
EOS: 6 %
HEMATOCRIT: 44.6 % (ref 37.5–51.0)
Hemoglobin: 15.5 g/dL (ref 12.6–17.7)
IMMATURE GRANULOCYTES: 0 %
Immature Grans (Abs): 0 10*3/uL (ref 0.0–0.1)
Lymphocytes Absolute: 1.5 10*3/uL (ref 0.7–3.1)
Lymphs: 20 %
MCH: 31.1 pg (ref 26.6–33.0)
MCHC: 34.8 g/dL (ref 31.5–35.7)
MCV: 89 fL (ref 79–97)
MONOS ABS: 1 10*3/uL — AB (ref 0.1–0.9)
Monocytes: 13 %
NEUTROS ABS: 4.5 10*3/uL (ref 1.4–7.0)
NEUTROS PCT: 61 %
Platelets: 206 10*3/uL (ref 150–379)
RBC: 4.99 x10E6/uL (ref 4.14–5.80)
RDW: 14.4 % (ref 12.3–15.4)
WBC: 7.4 10*3/uL (ref 3.4–10.8)

## 2015-11-23 LAB — NMR, LIPOPROFILE
CHOLESTEROL: 149 mg/dL (ref 100–199)
HDL CHOLESTEROL BY NMR: 54 mg/dL (ref >39)
HDL PARTICLE NUMBER: 34.7 umol/L (ref >=30.5)
LDL Particle Number: 921 nmol/L (ref <1000)
LDL SIZE: 21 nm (ref >20.5)
LDL-C: 68 mg/dL (ref 0–99)
LP-IR Score: 49 — ABNORMAL HIGH (ref <=45)
SMALL LDL PARTICLE NUMBER: 341 nmol/L (ref <=527)
TRIGLYCERIDES BY NMR: 133 mg/dL (ref 0–149)

## 2015-11-23 LAB — COMPREHENSIVE METABOLIC PANEL
A/G RATIO: 1.7 (ref 1.2–2.2)
ALT: 14 IU/L (ref 0–44)
AST: 23 IU/L (ref 0–40)
Albumin: 4.5 g/dL (ref 3.5–4.8)
Alkaline Phosphatase: 70 IU/L (ref 39–117)
BILIRUBIN TOTAL: 0.5 mg/dL (ref 0.0–1.2)
BUN/Creatinine Ratio: 18 (ref 10–22)
BUN: 24 mg/dL (ref 8–27)
CALCIUM: 9.6 mg/dL (ref 8.6–10.2)
CHLORIDE: 99 mmol/L (ref 96–106)
CO2: 22 mmol/L (ref 18–29)
Creatinine, Ser: 1.35 mg/dL — ABNORMAL HIGH (ref 0.76–1.27)
GFR, EST AFRICAN AMERICAN: 61 mL/min/{1.73_m2} (ref >59)
GFR, EST NON AFRICAN AMERICAN: 53 mL/min/{1.73_m2} — AB (ref >59)
GLOBULIN, TOTAL: 2.6 g/dL (ref 1.5–4.5)
Glucose: 104 mg/dL — ABNORMAL HIGH (ref 65–99)
POTASSIUM: 4.9 mmol/L (ref 3.5–5.2)
SODIUM: 138 mmol/L (ref 134–144)
TOTAL PROTEIN: 7.1 g/dL (ref 6.0–8.5)

## 2015-11-23 LAB — TSH: TSH: 1.98 u[IU]/mL (ref 0.450–4.500)

## 2015-11-23 LAB — T4, FREE: Free T4: 1.52 ng/dL (ref 0.82–1.77)

## 2015-11-23 LAB — PSA: Prostate Specific Ag, Serum: 1.7 ng/mL (ref 0.0–4.0)

## 2015-11-24 ENCOUNTER — Other Ambulatory Visit: Payer: Self-pay | Admitting: Family Medicine

## 2015-12-01 ENCOUNTER — Other Ambulatory Visit: Payer: Self-pay | Admitting: Family Medicine

## 2015-12-15 ENCOUNTER — Other Ambulatory Visit: Payer: Self-pay | Admitting: Family Medicine

## 2015-12-23 ENCOUNTER — Other Ambulatory Visit: Payer: Self-pay | Admitting: Family Medicine

## 2015-12-24 LAB — FECAL OCCULT BLOOD, IMMUNOCHEMICAL: Fecal Occult Bld: NEGATIVE

## 2015-12-25 ENCOUNTER — Ambulatory Visit (INDEPENDENT_AMBULATORY_CARE_PROVIDER_SITE_OTHER): Payer: 59 | Admitting: Family Medicine

## 2015-12-25 ENCOUNTER — Encounter: Payer: Self-pay | Admitting: Family Medicine

## 2015-12-25 VITALS — BP 120/62 | HR 51 | Temp 98.1°F | Ht 64.0 in | Wt 157.5 lb

## 2015-12-25 DIAGNOSIS — Z0000 Encounter for general adult medical examination without abnormal findings: Secondary | ICD-10-CM

## 2015-12-25 DIAGNOSIS — E785 Hyperlipidemia, unspecified: Secondary | ICD-10-CM

## 2015-12-25 DIAGNOSIS — E038 Other specified hypothyroidism: Secondary | ICD-10-CM | POA: Diagnosis not present

## 2015-12-25 DIAGNOSIS — I10 Essential (primary) hypertension: Secondary | ICD-10-CM | POA: Diagnosis not present

## 2015-12-25 DIAGNOSIS — R748 Abnormal levels of other serum enzymes: Secondary | ICD-10-CM

## 2015-12-25 DIAGNOSIS — R7989 Other specified abnormal findings of blood chemistry: Secondary | ICD-10-CM

## 2015-12-25 MED ORDER — LEVOTHYROXINE SODIUM 112 MCG PO TABS: 112.0000 ug | ORAL_TABLET | Freq: Every day | ORAL | Status: DC

## 2015-12-25 MED ORDER — METOPROLOL TARTRATE 50 MG PO TABS: ORAL_TABLET | ORAL | Status: DC

## 2015-12-25 MED ORDER — OLMESARTAN MEDOXOMIL 20 MG PO TABS: 20.0000 mg | ORAL_TABLET | Freq: Every day | ORAL | Status: DC

## 2015-12-25 MED ORDER — ROSUVASTATIN CALCIUM 10 MG PO TABS: 10.0000 mg | ORAL_TABLET | Freq: Every day | ORAL | Status: DC

## 2015-12-25 NOTE — Progress Notes (Signed)
Subjective:    Patient ID: Richard Ibarra, male    DOB: 10/07/1945, 70 y.o.   MRN: [id number]  HPI  70 yo pleasant male here for CPX and to review chronic medical conditions.   .   Td 11/04/2011 Zostavax 03/10/2010 Pneumovax 11/04/11 Saw Dr. Evorn Gong (dermatology)-.  Follows up every yearly now. Was last seen 3 months ago.  Doing well- still traveling a lot but enjoying it.  His 10 grandchildren are doing well.  Bladder stone- no recurrent stones or hematauria.  Sees Dr. Gaynelle Arabian  Denies any urinary symptoms.  Per pt, DRE was normal last month. Lab Results  Component Value Date   PSA 1.6 12/02/2012   PSA 2.3 09/23/2007   PSA 00 01/14/2007     HTN-  On Benicar 20 mg and Metroprolol 50 mg daily.  BP stable.  Lab Results  Component Value Date   CREATININE 1.35* 11/22/2015   HLD-  On niaspan 1000 mg daily and Crestor 10 mg daily.  Well controlled  Lab Results  Component Value Date   CHOL 149 11/22/2015   HDL 54 11/22/2015   LDLCALC 80 12/04/2013   TRIG 133 11/22/2015    Hypothyroidism- Remains on synthroid 112 mcg daily. Lab Results  Component Value Date   TSH 1.980 11/22/2015    Pt denies any symptoms of hypo or hyperthyroidism.  Still has not had a colonoscopy- will to do stool cards.  Neg IFOB this year.   Patient Active Problem List   Diagnosis Date Noted  . HLD (hyperlipidemia) 12/24/2014  . Elevated serum creatinine 12/19/2013  . Routine general medical examination at a health care facility 11/04/2011  . Hypothyroidism 01/12/2007  . Essential hypertension 01/12/2007  . ALLERGIC RHINITIS 01/12/2007   Past Medical History  Diagnosis Date  . Allergy   . Hypertension   . Thyroid disease   . Seasonal allergies 11-02-11    Tx. Allegra daily  . Gross hematuria 10-30-11    story consistent with exercise induced, eval by uro with 1.9cm stone L kidney  . BCC (basal cell carcinoma of skin)   . Hypothyroidism   . Chronic kidney disease     Elevated Creatine    Past Surgical History  Procedure Laterality Date  . Rash testing  02/17/1989    multiple positives  . Prostate biopsy  01/29/2006    Dr. Gaynelle Arabian  . Cystoscopy  11/09/2011    Procedure: CYSTOSCOPY;  Surgeon: Ailene Rud, MD;  Location: [facility];  Service: Urology;  Laterality: N/A;  . No past surgeries    . Inguinal hernia repair Right 02/14/2015    Procedure: RIGHT INGUINAL HERNIA REPAIR WITH MESH;  Surgeon: Erroll Luna, MD;  Location: [facility];  Service: General;  Laterality: Right;  . Insertion of mesh Right 02/14/2015    Procedure: INSERTION OF MESH;  Surgeon: Erroll Luna, MD;  Location: [facility];  Service: General;  Laterality: Right;   Social History  Substance Use Topics  . Smoking status: Never Smoker   . Smokeless tobacco: None  . Alcohol Use: None   Family History  Problem Relation Age of Onset  . Cerebral aneurysm Father    Allergies  Allergen Reactions  . Banana Swelling  . Food Swelling    All melons    Current Outpatient Prescriptions on File Prior to Visit  Medication Sig Dispense Refill  . fexofenadine (ALLEGRA) 180 MG tablet Take 1 tablet (180 mg total) by mouth daily. 90 tablet 3  .  finasteride (PROSCAR) 5 MG tablet Take 1 tablet (5 mg total) by mouth daily. 90 tablet 0  . NIASPAN 1000 MG CR tablet Take 1 tablet (1,000 mg total) by mouth at bedtime. OFFICE VISIT WITH LABS REQUIRED FOR ADDITIONAL REFILLS 90 tablet 0   Current Facility-Administered Medications on File Prior to Visit  Medication Dose Route Frequency Provider Last Rate Last Dose  . opium-belladonna (B&O SUPPRETTES) suppository 1 suppository  1 suppository Rectal Once Carolan Clines, MD       The PMH, PSH, Social History, Family History, Medications, and allergies have been reviewed in [facility], and have been updated if relevant.   Review of Systems    See HPI Review of Systems  Constitutional: Negative.   HENT: Negative.   Eyes: Negative.    Respiratory: Negative.   Cardiovascular: Negative.   Gastrointestinal: Negative.   Genitourinary: Negative.   Musculoskeletal: Negative.   Skin: Negative.   Allergic/Immunologic: Negative.   Neurological: Negative.   Hematological: Negative.   Psychiatric/Behavioral: Negative.       Objective:   Physical Exam BP 120/62 mmHg  Pulse 51  Temp(Src) 98.1 F (36.7 C) (Oral)  Ht 5\' 4"  (1.626 m)  Wt 157 lb 8 oz (71.442 kg)  BMI 27.02 kg/m2  SpO2 97%   .General:  Pleasant male in NAD Eyes:  PERRL Ears:  External ear exam shows no significant lesions or deformities.  Otoscopic examination reveals clear canals, tympanic membranes are intact bilaterally without bulging, retraction, inflammation or discharge. Hearing is grossly normal bilaterally. Nose:  External nasal examination shows no deformity or inflammation. Nasal mucosa are pink and moist without lesions or exudates. Mouth:  Oral mucosa and oropharynx without lesions or exudates.  Teeth in good repair. Neck:  no carotid bruit or thyromegaly no cervical or supraclavicular lymphadenopathy  Lungs:  Normal respiratory effort, chest expands symmetrically. Lungs are clear to auscultation, no crackles or wheezes. Heart:  Normal rate and regular rhythm. S1 and S2 normal without gallop, murmur, click, rub or other extra sounds. Abdomen:  Bowel sounds positive,abdomen soft and non-tender without masses, organomegaly or hernias noted. Prostate:  Deferred- recently followed by urology Pulses:  R and L posterior tibial pulses are full and equal bilaterally  Extremities:  no edema      Assessment & Plan:

## 2015-12-25 NOTE — Assessment & Plan Note (Signed)
Well controlled on current dose of synthroid. No changes made today. 

## 2015-12-25 NOTE — Assessment & Plan Note (Signed)
Continues to remain at baseline- fluctuates. Encouraged water intake.

## 2015-12-25 NOTE — Patient Instructions (Signed)
Great to see you. Have a wonderful summer! 

## 2015-12-25 NOTE — Assessment & Plan Note (Signed)
Well controlled on current rxs. No changes made today. 

## 2015-12-25 NOTE — Assessment & Plan Note (Signed)
Reviewed preventive care protocols, scheduled due services, and updated immunizations Discussed nutrition, exercise, diet, and healthy lifestyle.  

## 2016-02-26 ENCOUNTER — Other Ambulatory Visit: Payer: Self-pay | Admitting: Family Medicine

## 2016-07-15 ENCOUNTER — Telehealth: Payer: Self-pay | Admitting: Family Medicine

## 2016-07-15 NOTE — Telephone Encounter (Signed)
This assessment has to be completed by Dr Deborra Medina. Paperwork placed in her inbox

## 2016-07-15 NOTE — Telephone Encounter (Signed)
Paperwork faxed in from [place] can you help me with this Thanks  On waynetta's desk

## 2016-07-21 NOTE — Telephone Encounter (Signed)
Paperwork faxed °Copy for pt  °Copy for file °Copy for scan °Pt aware  °

## 2016-08-13 ENCOUNTER — Encounter: Payer: Self-pay | Admitting: Family Medicine

## 2016-08-13 ENCOUNTER — Ambulatory Visit (INDEPENDENT_AMBULATORY_CARE_PROVIDER_SITE_OTHER): Payer: 59 | Admitting: Family Medicine

## 2016-08-13 VITALS — BP 134/68 | HR 68 | Temp 98.0°F | Wt 157.5 lb

## 2016-08-13 DIAGNOSIS — J020 Streptococcal pharyngitis: Secondary | ICD-10-CM

## 2016-08-13 LAB — POCT RAPID STREP A (OFFICE): Rapid Strep A Screen: POSITIVE — AB

## 2016-08-13 MED ORDER — PHENOL 1.4 % MT LIQD: 1.0000 | OROMUCOSAL | 0 refills | Status: DC | PRN

## 2016-08-13 MED ORDER — AMOXICILLIN 500 MG PO CAPS: 500.0000 mg | ORAL_CAPSULE | Freq: Two times a day (BID) | ORAL | 0 refills | Status: DC

## 2016-08-13 NOTE — Progress Notes (Addendum)
BP 134/68 (BP Location: Left Arm, Patient Position: Sitting, Cuff Size: Normal)   Pulse 68   Temp 98 F (36.7 C) (Oral)   Wt 157 lb 8 oz (71.4 kg)   SpO2 97%   BMI 27.03 kg/m    CC: URI sxs Subjective:    Patient ID: Alexis Goodell, male    DOB: 1945-10-19, 70 y.o.   MRN: [id number]  HPI: HJALMAR STUKES is a 70 y.o. male presenting on 08/13/2016 for Cough; Nasal Congestion; Fever; and Sore Throat   5d h/o cough, nasal congestion and pressure, low grade fever. Cough productive of colored sputum. Tmax 99.6 this morning.  Main concern is sore throat. + lethargy.  No body aches, myalgias, no fever/chill, ear or tooth pain, headaches. No skin rash.   Taking OTC remedies with temporary improvement. Taking afrin and guaifenesin.  No sick contacts at home No smokers at home.  No h/o asthma.   Relevant past medical, surgical, family and social history reviewed and updated as indicated. Interim medical history since our last visit reviewed. Allergies and medications reviewed and updated. Current Outpatient Prescriptions on File Prior to Visit  Medication Sig  . fexofenadine (ALLEGRA) 180 MG tablet Take 1 tablet (180 mg total) by mouth daily.  . finasteride (PROSCAR) 5 MG tablet Take 1 tablet (5 mg total) by mouth daily.  Marland Kitchen levothyroxine (SYNTHROID, LEVOTHROID) 112 MCG tablet Take 1 tablet (112 mcg total) by mouth daily.  . metoprolol (LOPRESSOR) 50 MG tablet Take 1 tablet by mouth  daily before breakfast  . NIASPAN 1000 MG CR tablet TAKE 1 TABLET BY MOUTH AT  BEDTIME  . olmesartan (BENICAR) 20 MG tablet Take 1 tablet (20 mg total) by mouth at bedtime.  . rosuvastatin (CRESTOR) 10 MG tablet Take 1 tablet (10 mg total) by mouth at bedtime.   Current Facility-Administered Medications on File Prior to Visit  Medication  . opium-belladonna (B&O SUPPRETTES) suppository 1 suppository    Review of Systems Per HPI unless specifically indicated in ROS section     Objective:    BP  134/68 (BP Location: Left Arm, Patient Position: Sitting, Cuff Size: Normal)   Pulse 68   Temp 98 F (36.7 C) (Oral)   Wt 157 lb 8 oz (71.4 kg)   SpO2 97%   BMI 27.03 kg/m   Wt Readings from Last 3 Encounters:  08/13/16 157 lb 8 oz (71.4 kg)  12/25/15 157 lb 8 oz (71.4 kg)  02/14/15 152 lb 3.2 oz (69 kg)    Physical Exam  Constitutional: He appears well-developed and well-nourished. No distress.  HENT:  Head: Normocephalic and atraumatic.  Right Ear: Hearing, tympanic membrane, external ear and ear canal normal.  Left Ear: Hearing, tympanic membrane, external ear and ear canal normal.  Nose: Mucosal edema and rhinorrhea present. Right sinus exhibits no maxillary sinus tenderness and no frontal sinus tenderness. Left sinus exhibits no maxillary sinus tenderness and no frontal sinus tenderness.  Mouth/Throat: Uvula is midline and mucous membranes are normal. Posterior oropharyngeal edema and posterior oropharyngeal erythema present. No oropharyngeal exudate or tonsillar abscesses.  Erosions of soft palate as well as blisters/erosions of oropharynx  Eyes: Conjunctivae and EOM are normal. Pupils are equal, round, and reactive to light. No scleral icterus.  Neck: Normal range of motion. Neck supple.  Cardiovascular: Normal rate, regular rhythm, normal heart sounds and intact distal pulses.   No murmur heard. Pulmonary/Chest: Effort normal and breath sounds normal. No respiratory distress. He has no  wheezes. He has no rales.  Lymphadenopathy:    He has no cervical adenopathy.  Skin: Skin is warm and dry. No rash noted.  Nursing note and vitals reviewed.     Assessment & Plan:   Problem List Items Addressed This Visit    Strep pharyngitis - Primary    Acute pharyngitis with URI and oropharynx blisters/sores. RST mildly positive. Treat with amox 500mg  TID course.  Discussed supportive care including NSAID dosing.  Chloraseptic spray for symptoms. Update if not improved with  treatment.          Follow up plan: No Follow-up on file.  Ria Bush, MD

## 2016-08-13 NOTE — Assessment & Plan Note (Addendum)
Acute pharyngitis with URI and oropharynx blisters/sores. RST mildly positive. Treat with amox 500mg  TID course.  Discussed supportive care including NSAID dosing.  Chloraseptic spray for symptoms. Update if not improved with treatment.

## 2016-08-13 NOTE — Addendum Note (Signed)
Addended by: Ria Bush on: 08/13/2016 05:15 PM   Modules accepted: Orders

## 2016-08-13 NOTE — Patient Instructions (Addendum)
You have acute pharyngitis and upper respiratory infection, strep test was positive. Treat with amoxicillin 500mg  twice daily for 10 days. Push fluids and plenty of rest. May use ibuprofen 600mg  with meals for throat inflammation. Salt water gargles.  Suck on cold things like popsicles or warm things like herbal teas (whichever soothes the throat better). Return if fever >101.5, worsening pain, or trouble opening/closing mouth, or hoarse voice. Good to see you today, call clinic with questions.

## 2016-08-13 NOTE — Progress Notes (Signed)
Pre visit review using our clinic review tool, if applicable. No additional management support is needed unless otherwise documented below in the visit note. 

## 2016-08-13 NOTE — Addendum Note (Signed)
Addended by: Royann Shivers A on: 08/13/2016 05:22 PM   Modules accepted: Orders

## 2016-08-30 ENCOUNTER — Other Ambulatory Visit: Payer: Self-pay | Admitting: Family Medicine

## 2016-09-04 ENCOUNTER — Ambulatory Visit (INDEPENDENT_AMBULATORY_CARE_PROVIDER_SITE_OTHER): Payer: 59

## 2016-09-04 DIAGNOSIS — Z23 Encounter for immunization: Secondary | ICD-10-CM

## 2016-11-23 ENCOUNTER — Other Ambulatory Visit: Payer: Self-pay | Admitting: Family Medicine

## 2016-12-06 ENCOUNTER — Other Ambulatory Visit: Payer: Self-pay | Admitting: Family Medicine

## 2016-12-27 ENCOUNTER — Other Ambulatory Visit: Payer: Self-pay | Admitting: Family Medicine

## 2016-12-28 ENCOUNTER — Encounter: Payer: Self-pay | Admitting: Family Medicine

## 2016-12-28 ENCOUNTER — Ambulatory Visit (INDEPENDENT_AMBULATORY_CARE_PROVIDER_SITE_OTHER): Payer: 59 | Admitting: Family Medicine

## 2016-12-28 VITALS — BP 114/76 | HR 52 | Temp 98.3°F | Ht 64.0 in | Wt 154.0 lb

## 2016-12-28 DIAGNOSIS — R7989 Other specified abnormal findings of blood chemistry: Secondary | ICD-10-CM | POA: Diagnosis not present

## 2016-12-28 DIAGNOSIS — E038 Other specified hypothyroidism: Secondary | ICD-10-CM

## 2016-12-28 DIAGNOSIS — I10 Essential (primary) hypertension: Secondary | ICD-10-CM | POA: Diagnosis not present

## 2016-12-28 DIAGNOSIS — E785 Hyperlipidemia, unspecified: Secondary | ICD-10-CM | POA: Diagnosis not present

## 2016-12-28 DIAGNOSIS — Z0000 Encounter for general adult medical examination without abnormal findings: Secondary | ICD-10-CM | POA: Diagnosis not present

## 2016-12-28 MED ORDER — NIASPAN 1000 MG PO TBCR: 1000.0000 mg | EXTENDED_RELEASE_TABLET | Freq: Every day | ORAL | 3 refills | Status: DC

## 2016-12-28 MED ORDER — ROSUVASTATIN CALCIUM 10 MG PO TABS: 10.0000 mg | ORAL_TABLET | Freq: Every day | ORAL | 3 refills | Status: DC

## 2016-12-28 MED ORDER — FINASTERIDE 5 MG PO TABS: 5.0000 mg | ORAL_TABLET | Freq: Every day | ORAL | 3 refills | Status: DC

## 2016-12-28 MED ORDER — LEVOTHYROXINE SODIUM 112 MCG PO TABS: 112.0000 ug | ORAL_TABLET | Freq: Every day | ORAL | 3 refills | Status: DC

## 2016-12-28 MED ORDER — METOPROLOL TARTRATE 50 MG PO TABS: ORAL_TABLET | ORAL | 3 refills | Status: DC

## 2016-12-28 MED ORDER — OLMESARTAN MEDOXOMIL 20 MG PO TABS: 20.0000 mg | ORAL_TABLET | Freq: Every day | ORAL | 3 refills | Status: DC

## 2016-12-28 NOTE — Assessment & Plan Note (Signed)
Well controlled. Continue current dose of synthroid

## 2016-12-28 NOTE — Patient Instructions (Signed)
Great to see you.  We will call you with your lab results. 

## 2016-12-28 NOTE — Progress Notes (Signed)
Subjective:    Patient ID: Richard Ibarra, male    DOB: 22-Jun-1946, 71 y.o.   MRN: [id number]  HPI  71 yo pleasant male here for CPX and to review chronic medical conditions.   .   Td 11/04/2011 Zostavax 03/10/2010 Pneumovax 11/04/11 Saw Dr. Evorn Gong (dermatology)-.  Follows up every yearly now. Was last seen 2 months ago.   Seeing urology, Dr. Gaynelle Arabian, tomorrow.  Feels like his doing well.  Lab Results  Component Value Date   PSA 1.6 12/02/2012   PSA 2.3 09/23/2007   PSA 00 01/14/2007    HTN-  On Benicar 20 mg and Metroprolol 50 mg daily.  BP stable.  Lab Results  Component Value Date   CREATININE 1.35 (H) 11/22/2015   HLD-  On niaspan 1000 mg daily and Crestor 10 mg daily.  Well controlled  Lab Results  Component Value Date   CHOL 149 11/22/2015   HDL 54 11/22/2015   LDLCALC 80 12/04/2013   TRIG 133 11/22/2015    Hypothyroidism- Remains on synthroid 112 mcg daily. Lab Results  Component Value Date   TSH 1.980 11/22/2015    Pt denies any symptoms of hypo or hyperthyroidism.  Still has not had a colonoscopy- will to do stool cards.  Neg IFOB last May.   Patient Active Problem List   Diagnosis Date Noted  . Strep pharyngitis 08/13/2016  . HLD (hyperlipidemia) 12/24/2014  . Elevated serum creatinine 12/19/2013  . Routine general medical examination at a health care facility 11/04/2011  . Hypothyroidism 01/12/2007  . Essential hypertension 01/12/2007  . ALLERGIC RHINITIS 01/12/2007   Past Medical History:  Diagnosis Date  . Allergy   . BCC (basal cell carcinoma of skin)   . Chronic kidney disease    Elevated Creatine  . Gross hematuria 10-30-11   story consistent with exercise induced, eval by uro with 1.9cm stone L kidney  . Hypertension   . Hypothyroidism   . Seasonal allergies 11-02-11   Tx. Allegra daily  . Thyroid disease    Past Surgical History:  Procedure Laterality Date  . CYSTOSCOPY  11/09/2011   Procedure: CYSTOSCOPY;  Surgeon: Ailene Rud, MD;  Location: [facility];  Service: Urology;  Laterality: N/A;  . INGUINAL HERNIA REPAIR Right 02/14/2015   Procedure: RIGHT INGUINAL HERNIA REPAIR WITH MESH;  Surgeon: Erroll Luna, MD;  Location: [facility];  Service: General;  Laterality: Right;  . INSERTION OF MESH Right 02/14/2015   Procedure: INSERTION OF MESH;  Surgeon: Erroll Luna, MD;  Location: [facility];  Service: General;  Laterality: Right;  . NO PAST SURGERIES    . PROSTATE BIOPSY  01/29/2006   Dr. Gaynelle Arabian  . rash testing  02/17/1989   multiple positives   Social History  Substance Use Topics  . Smoking status: Never Smoker  . Smokeless tobacco: Never Used  . Alcohol use Not on file   Family History  Problem Relation Age of Onset  . Cerebral aneurysm Father    Allergies  Allergen Reactions  . Banana Swelling  . Food Swelling    All melons    Current Outpatient Prescriptions on File Prior to Visit  Medication Sig Dispense Refill  . fexofenadine (ALLEGRA) 180 MG tablet Take 1 tablet (180 mg total) by mouth daily. 90 tablet 3  . finasteride (PROSCAR) 5 MG tablet Take 1 tablet (5 mg total) by mouth daily. 90 tablet 0  . levothyroxine (SYNTHROID, LEVOTHROID) 112 MCG tablet TAKE 1  TABLET BY MOUTH  DAILY 90 tablet 2  . metoprolol (LOPRESSOR) 50 MG tablet TAKE 1 TABLET BY MOUTH  DAILY BEFORE BREAKFAST 90 tablet 2  . NIASPAN 1000 MG CR tablet TAKE 1 TABLET BY MOUTH AT  BEDTIME 90 tablet 0  . olmesartan (BENICAR) 20 MG tablet Take 1 tablet (20 mg total) by mouth at bedtime. COMPLETE PHYSICAL EXAM REQUIRED FOR ADDITIONAL REFILLS 90 tablet 0  . rosuvastatin (CRESTOR) 10 MG tablet TAKE 1 TABLET BY MOUTH AT  BEDTIME 90 tablet 2   Current Facility-Administered Medications on File Prior to Visit  Medication Dose Route Frequency Provider Last Rate Last Dose  . opium-belladonna (B&O SUPPRETTES) suppository 1 suppository  1 suppository Rectal Once Carolan Clines, MD       The  PMH, PSH, Social History, Family History, Medications, and allergies have been reviewed in [facility], and have been updated if relevant.   Review of Systems    See HPI Review of Systems  Constitutional: Negative.   HENT: Negative.   Eyes: Negative.   Respiratory: Negative.   Cardiovascular: Negative.   Gastrointestinal: Negative.   Genitourinary: Negative.   Musculoskeletal: Negative.   Skin: Negative.   Allergic/Immunologic: Negative.   Neurological: Negative.   Hematological: Negative.   Psychiatric/Behavioral: Negative.       Objective:   Physical Exam BP 114/76 (BP Location: Left Arm, Patient Position: Sitting, Cuff Size: Large)   Pulse (!) 52   Temp 98.3 F (36.8 C) (Oral)   Ht 5\' 4"  (1.626 m)   Wt 154 lb (69.9 kg)   SpO2 97%   BMI 26.43 kg/m   General:  pleasant male in no acute distress Eyes:  PERRL Ears:  External ear exam shows no significant lesions or deformities.  TMs normal bilaterally Hearing is grossly normal bilaterally. Nose:  External nasal examination shows no deformity or inflammation. Nasal mucosa are pink and moist without lesions or exudates. Mouth:  Oral mucosa and oropharynx without lesions or exudates.  Teeth in good repair. Neck:  no carotid bruit or thyromegaly no cervical or supraclavicular lymphadenopathy  Lungs:  Normal respiratory effort, chest expands symmetrically. Lungs are clear to auscultation, no crackles or wheezes. Heart:  Normal rate and regular rhythm. S1 and S2 normal without gallop, murmur, click, rub or other extra sounds. Abdomen:  Bowel sounds positive,abdomen soft and non-tender without masses, organomegaly or hernias noted. Pulses:  R and L posterior tibial pulses are full and equal bilaterally  Extremities:  no edema  Psych:  Good eye contact, not anxious or depressed appearing      Assessment & Plan:

## 2016-12-28 NOTE — Assessment & Plan Note (Signed)
Reviewed preventive care protocols, scheduled due services, and updated immunizations Discussed nutrition, exercise, diet, and healthy lifestyle.  Orders Placed This Encounter  Procedures  . CBC with Differential/Platelet  . Comprehensive metabolic panel  . Lipid panel  . PSA  . T4, Free

## 2016-12-28 NOTE — Assessment & Plan Note (Signed)
Normotensive on current rxs.

## 2016-12-28 NOTE — Assessment & Plan Note (Signed)
Continue current rxs. 

## 2016-12-28 NOTE — Addendum Note (Signed)
Addended by: Lucille Passy on: 12/28/2016 08:51 AM   Modules accepted: Orders

## 2016-12-29 LAB — COMPREHENSIVE METABOLIC PANEL
ALBUMIN: 4.3 g/dL (ref 3.5–4.8)
ALK PHOS: 64 IU/L (ref 39–117)
ALT: 12 IU/L (ref 0–44)
AST: 21 IU/L (ref 0–40)
Albumin/Globulin Ratio: 1.6 (ref 1.2–2.2)
BUN / CREAT RATIO: 17 (ref 10–24)
BUN: 21 mg/dL (ref 8–27)
Bilirubin Total: 0.4 mg/dL (ref 0.0–1.2)
CO2: 23 mmol/L (ref 18–29)
CREATININE: 1.25 mg/dL (ref 0.76–1.27)
Calcium: 9.7 mg/dL (ref 8.6–10.2)
Chloride: 101 mmol/L (ref 96–106)
GFR calc non Af Amer: 58 mL/min/{1.73_m2} — ABNORMAL LOW (ref >59)
GFR, EST AFRICAN AMERICAN: 67 mL/min/{1.73_m2} (ref >59)
GLOBULIN, TOTAL: 2.7 g/dL (ref 1.5–4.5)
Glucose: 106 mg/dL — ABNORMAL HIGH (ref 65–99)
Potassium: 5.1 mmol/L (ref 3.5–5.2)
SODIUM: 139 mmol/L (ref 134–144)
Total Protein: 7 g/dL (ref 6.0–8.5)

## 2016-12-29 LAB — CBC WITH DIFFERENTIAL/PLATELET
Basophils Absolute: 0 10*3/uL (ref 0.0–0.2)
Basos: 0 %
EOS (ABSOLUTE): 0.4 10*3/uL (ref 0.0–0.4)
Eos: 6 %
HEMATOCRIT: 44.3 % (ref 37.5–51.0)
HEMOGLOBIN: 15.2 g/dL (ref 13.0–17.7)
IMMATURE GRANULOCYTES: 0 %
Immature Grans (Abs): 0 10*3/uL (ref 0.0–0.1)
Lymphocytes Absolute: 1.2 10*3/uL (ref 0.7–3.1)
Lymphs: 18 %
MCH: 30.5 pg (ref 26.6–33.0)
MCHC: 34.3 g/dL (ref 31.5–35.7)
MCV: 89 fL (ref 79–97)
Monocytes Absolute: 0.8 10*3/uL (ref 0.1–0.9)
Monocytes: 12 %
NEUTROS PCT: 64 %
Neutrophils Absolute: 4.3 10*3/uL (ref 1.4–7.0)
Platelets: 203 10*3/uL (ref 150–379)
RBC: 4.99 x10E6/uL (ref 4.14–5.80)
RDW: 14.9 % (ref 12.3–15.4)
WBC: 6.8 10*3/uL (ref 3.4–10.8)

## 2016-12-29 LAB — T4, FREE: Free T4: 1.49 ng/dL (ref 0.82–1.77)

## 2016-12-29 LAB — NMR, LIPOPROFILE
CHOLESTEROL: 142 mg/dL (ref 100–199)
HDL Cholesterol by NMR: 55 mg/dL (ref >39)
HDL PARTICLE NUMBER: 36 umol/L (ref >=30.5)
LDL PARTICLE NUMBER: 901 nmol/L (ref <1000)
LDL SIZE: 21.2 nm (ref >20.5)
LDL-C: 69 mg/dL (ref 0–99)
LP-IR Score: 54 — ABNORMAL HIGH (ref <=45)
Small LDL Particle Number: 387 nmol/L (ref <=527)
TRIGLYCERIDES BY NMR: 90 mg/dL (ref 0–149)

## 2016-12-29 LAB — LIPID PANEL
CHOLESTEROL TOTAL: 142 mg/dL (ref 100–199)
Chol/HDL Ratio: 2.5 ratio (ref 0.0–5.0)
HDL: 56 mg/dL (ref >39)
LDL CALC: 68 mg/dL (ref 0–99)
Triglycerides: 89 mg/dL (ref 0–149)
VLDL Cholesterol Cal: 18 mg/dL (ref 5–40)

## 2016-12-29 LAB — PSA: Prostate Specific Ag, Serum: 4.5 ng/mL — ABNORMAL HIGH (ref 0.0–4.0)

## 2017-03-09 ENCOUNTER — Encounter: Payer: Self-pay | Admitting: Family Medicine

## 2017-03-09 ENCOUNTER — Ambulatory Visit (INDEPENDENT_AMBULATORY_CARE_PROVIDER_SITE_OTHER): Payer: 59 | Admitting: Family Medicine

## 2017-03-09 VITALS — BP 112/66 | HR 53 | Temp 97.7°F | Ht 64.0 in | Wt 157.2 lb

## 2017-03-09 DIAGNOSIS — J029 Acute pharyngitis, unspecified: Secondary | ICD-10-CM

## 2017-03-09 LAB — POCT RAPID STREP A (OFFICE): RAPID STREP A SCREEN: NEGATIVE

## 2017-03-09 NOTE — Progress Notes (Signed)
Dr. Frederico Hamman T. Aysia Lowder, MD, [facility] [place], [address] Phone: [phone] Fax: [phone]  03/09/2017  Patient: Richard Ibarra, MRN: [id number], DOB: [birth year], 71 y.o.  Primary Physician:  Lucille Passy, MD   Chief Complaint  Patient presents with  . Sore Throat    started last night   Subjective:   Richard Ibarra is a 71 y.o. very pleasant male patient who presents with the following:  Patient had a sore throat that started last night. He denies any other symptoms and has no URI type symptoms at all. Afebrile.  Past Medical History, Surgical History, Social History, Family History, Problem List, Medications, and Allergies have been reviewed and updated if relevant.  Patient Active Problem List   Diagnosis Date Noted  . HLD (hyperlipidemia) 12/24/2014  . Elevated serum creatinine 12/19/2013  . Routine general medical examination at a health care facility 11/04/2011  . Hypothyroidism 01/12/2007  . Essential hypertension 01/12/2007  . ALLERGIC RHINITIS 01/12/2007    Past Medical History:  Diagnosis Date  . Allergy   . BCC (basal cell carcinoma of skin)   . Chronic kidney disease    Elevated Creatine  . Gross hematuria 10-30-11   story consistent with exercise induced, eval by uro with 1.9cm stone L kidney  . Hypertension   . Hypothyroidism   . Seasonal allergies 11-02-11   Tx. Allegra daily  . Thyroid disease     Past Surgical History:  Procedure Laterality Date  . CYSTOSCOPY  11/09/2011   Procedure: CYSTOSCOPY;  Surgeon: Ailene Rud, MD;  [facility];  Service: Urology;  Laterality: N/A;  . INGUINAL HERNIA REPAIR Right 02/14/2015   Procedure: RIGHT INGUINAL HERNIA REPAIR WITH MESH;  Surgeon: Erroll Luna, MD;  Location: [facility];  Service: General;  Laterality: Right;  . INSERTION OF MESH Right 02/14/2015   Procedure: INSERTION OF MESH;  Surgeon: Erroll Luna,  MD;  Location: [facility];  Service: General;  Laterality: Right;  . NO PAST SURGERIES    . PROSTATE BIOPSY  01/29/2006   Dr. Gaynelle Arabian  . rash testing  02/17/1989   multiple positives    Social History   Social History  . Marital status: Married    Spouse name: N/A  . Number of children: 2  . Years of education: N/A   Occupational History  . Lab Director [employer]   Social History Main Topics  . Smoking status: Never Smoker  . Smokeless tobacco: Never Used  . Alcohol use Not on file  . Drug use: Unknown  . Sexual activity: Not on file   Other Topics Concern  . Not on file   Social History Narrative   Active in [employer]    Family History  Problem Relation Age of Onset  . Cerebral aneurysm Father     Allergies  Allergen Reactions  . Banana Swelling  . Food Swelling    All melons     Medication list reviewed and updated in full in [place].  ROS: GEN: Acute illness details above GI: Tolerating PO intake GU: maintaining adequate hydration and urination Pulm: No SOB Interactive and getting along well at home.  Otherwise, ROS is as per the HPI.  Objective:   BP 112/66   Pulse (!) 53   Temp 97.7 F (36.5 C) (Oral)   Ht 5\' 4"  (1.626 m)   Wt 157 lb 4 oz (71.3  kg)   SpO2 98%   BMI 26.99 kg/m    Gen: WDWN, NAD; A & O x3, cooperative. Pleasant.Globally Non-toxic HEENT: Normocephalic and atraumatic. Throat: normal R TM clear, L TM - good landmarks, No fluid present. no rhinnorhea. No frontal or maxillary sinus T. MMM NECK: Anterior cervical  LAD is present - nt CV: RRR, No M/G/R, cap refill <2 sec PULM: Breathing comfortably in no respiratory distress. no wheezing, crackles, rhonchi ABD: S,NT,ND,+BS. No HSM. No rebound. EXT: No c/c/e PSYCH: Friendly, good eye contact    Laboratory and Imaging Data: Results for orders placed or performed in visit on 03/09/17  POCT rapid strep A  Result Value Ref Range   Rapid Strep A Screen  Negative Negative     Assessment and Plan:   Sore throat - Plan: POCT rapid strep A  Viral. Supportive care.  Follow-up: No Follow-up on file.  Orders Placed This Encounter  Procedures  . POCT rapid strep A    Signed,  Crystal Scarberry T. Kennieth Plotts, MD   Allergies as of 03/09/2017      Reactions   Banana Swelling   Food Swelling   All melons      Medication List       Accurate as of 03/09/17 11:33 AM. Always use your most recent med list.          fexofenadine 180 MG tablet Commonly known as:  ALLEGRA Take 1 tablet (180 mg total) by mouth daily.   finasteride 5 MG tablet Commonly known as:  PROSCAR Take 1 tablet (5 mg total) by mouth daily.   levothyroxine 112 MCG tablet Commonly known as:  SYNTHROID, LEVOTHROID Take 1 tablet (112 mcg total) by mouth daily.   metoprolol tartrate 50 MG tablet Commonly known as:  LOPRESSOR TAKE 1 TABLET BY MOUTH  DAILY BEFORE BREAKFAST   NIASPAN 1000 MG CR tablet Generic drug:  niacin Take 1 tablet (1,000 mg total) by mouth at bedtime.   olmesartan 20 MG tablet Commonly known as:  BENICAR Take 1 tablet (20 mg total) by mouth at bedtime.   rosuvastatin 10 MG tablet Commonly known as:  CRESTOR Take 1 tablet (10 mg total) by mouth at bedtime.

## 2017-04-02 ENCOUNTER — Telehealth: Payer: Self-pay

## 2017-04-02 MED ORDER — LEVOTHYROXINE SODIUM 112 MCG PO TABS: 112.0000 ug | ORAL_TABLET | Freq: Every day | ORAL | 1 refills | Status: DC

## 2017-04-02 NOTE — Telephone Encounter (Signed)
Pt left v/m requesting levothyroxine 112 mcg # 30 x1 to [place]; [facility] at this time. Pt cked and [facility] has med. Refill done as requested. Pt notified done and pt voiced understanding.

## 2017-06-18 ENCOUNTER — Ambulatory Visit (INDEPENDENT_AMBULATORY_CARE_PROVIDER_SITE_OTHER): Payer: 59

## 2017-06-18 DIAGNOSIS — Z23 Encounter for immunization: Secondary | ICD-10-CM | POA: Diagnosis not present

## 2017-06-27 ENCOUNTER — Other Ambulatory Visit: Payer: Self-pay | Admitting: Family Medicine

## 2017-09-02 ENCOUNTER — Ambulatory Visit: Payer: Self-pay | Admitting: *Deleted

## 2017-09-02 NOTE — Telephone Encounter (Signed)
Patient has been out of town for [employer] in [place]. He has arrived back home with severe vertigo with nausea and vomiting. He did have an episode 8 months ago that resolved rather quickly but this is not.he has an appointment in the morning- but is requesting something for nausea until his appointment. His wife can be reached on the cell #. Patient states this is reoccurring vertigo- he has appointment in the morning with Dr Ethelene Hal.

## 2017-09-03 ENCOUNTER — Encounter: Payer: Self-pay | Admitting: Family Medicine

## 2017-09-03 ENCOUNTER — Ambulatory Visit (INDEPENDENT_AMBULATORY_CARE_PROVIDER_SITE_OTHER): Payer: BLUE CROSS/BLUE SHIELD | Admitting: Family Medicine

## 2017-09-03 VITALS — BP 130/80 | HR 55 | Temp 98.2°F | Ht 64.0 in | Wt 154.4 lb

## 2017-09-03 DIAGNOSIS — H8309 Labyrinthitis, unspecified ear: Secondary | ICD-10-CM | POA: Diagnosis not present

## 2017-09-03 MED ORDER — MECLIZINE HCL 32 MG PO TABS: 32.0000 mg | ORAL_TABLET | Freq: Three times a day (TID) | ORAL | 0 refills | Status: DC | PRN

## 2017-09-03 NOTE — Patient Instructions (Addendum)
Labyrinthitis Labyrinthitis is an infection of the inner ear. Your inner ear is a system of tubes and canals (labyrinth). These are filled with fluid. Nerve cells in your inner ear send signals for hearing and balance to your brain. When tiny germs get inside the tubes and canals, they harm the cells that send messages to the brain. This can cause changes in hearing and balance. Follow these instructions at home:  Take medicines only as told by your doctor.  If you were prescribed an antibiotic medicine, finish all of it even if you start to feel better.  Rest as much as possible.  Avoid loud noises and bright lights.  Do not make sudden movements until any dizziness goes away.  Do not drive until your doctor says that you can.  Drink enough fluid to keep your pee (urine) clear or pale yellow.  Work with a physical therapist if you still feel dizzy after several weeks. A therapist can teach you exercises to help you deal with your dizziness.  Keep all follow-up visits as told by your doctor. This is important. Contact a doctor if:  Your symptoms do not get better with medicines.  You do not get better after two weeks.  You have a fever. Get help right away if:  You are very dizzy.  You keep throwing up (vomiting) or keep feeling sick to your stomach (nauseous).  Your hearing gets a lot worse very quickly. This information is not intended to replace advice given to you by your health care provider. Make sure you discuss any questions you have with your health care provider. Document Released: 08/10/2005 Document Revised: 01/16/2016 Document Reviewed: 05/22/2014 Elsevier Interactive Patient Education  2018 Elsevier Inc. How to Perform the Epley Maneuver The Epley maneuver is an exercise that relieves symptoms of vertigo. Vertigo is the feeling that you or your surroundings are moving when they are not. When you feel vertigo, you may feel like the room is spinning and have  trouble walking. Dizziness is a little different than vertigo. When you are dizzy, you may feel unsteady or light-headed. You can do this maneuver at home whenever you have symptoms of vertigo. You can do it up to 3 times a day until your symptoms go away. Even though the Epley maneuver may relieve your vertigo for a few weeks, it is possible that your symptoms will return. This maneuver relieves vertigo, but it does not relieve dizziness. What are the risks? If it is done correctly, the Epley maneuver is considered safe. Sometimes it can lead to dizziness or nausea that goes away after a short time. If you develop other symptoms, such as changes in vision, weakness, or numbness, stop doing the maneuver and call your health care provider. How to perform the Epley maneuver 1. Sit on the edge of a bed or table with your back straight and your legs extended or hanging over the edge of the bed or table. 2. Turn your head halfway toward the affected ear or side. 3. Lie backward quickly with your head turned until you are lying flat on your back. You may want to position a pillow under your shoulders. 4. Hold this position for 30 seconds. You may experience an attack of vertigo. This is normal. 5. Turn your head to the opposite direction until your unaffected ear is facing the floor. 6. Hold this position for 30 seconds. You may experience an attack of vertigo. This is normal. Hold this position until the vertigo stops.   7. Turn your whole body to the same side as your head. Hold for another 30 seconds. 8. Sit back up. You can repeat this exercise up to 3 times a day. Follow these instructions at home:  After doing the Epley maneuver, you can return to your normal activities.  Ask your health care provider if there is anything you should do at home to prevent vertigo. He or she may recommend that you: ? Keep your head raised (elevated) with two or more pillows while you sleep. ? Do not sleep on the  side of your affected ear. ? Get up slowly from bed. ? Avoid sudden movements during the day. ? Avoid extreme head movement, like looking up or bending over. Contact a health care provider if:  Your vertigo gets worse.  You have other symptoms, including: ? Nausea. ? Vomiting. ? Headache. Get help right away if:  You have vision changes.  You have a severe or worsening headache or neck pain.  You cannot stop vomiting.  You have new numbness or weakness in any part of your body. Summary  Vertigo is the feeling that you or your surroundings are moving when they are not.  The Epley maneuver is an exercise that relieves symptoms of vertigo.  If the Epley maneuver is done correctly, it is considered safe. You can do it up to 3 times a day. This information is not intended to replace advice given to you by your health care provider. Make sure you discuss any questions you have with your health care provider. Document Released: 08/15/2013 Document Revised: 06/30/2016 Document Reviewed: 06/30/2016 Elsevier Interactive Patient Education  2017 Elsevier Inc.  

## 2017-09-03 NOTE — Progress Notes (Signed)
Subjective:  Patient ID: Richard Ibarra, male    DOB: 11-07-1945  Age: 72 y.o. MRN: [id number]  CC: Dizziness   HPI Richard Ibarra presents for evaluation of a 2-day history of self-described vertigo.  He denies spending as such but tells of a severe sensation of movement that was exacerbated with moving his head up or down or to the left or right.  His random medicines for this is somewhat improved today.  He denies headache dysphagia or unilateral weakness or paresthesia.  Blood pressure is well controlled.  Chart review shows decreased pulse rate over his last 3 clinic visits.  Outpatient Medications Prior to Visit  Medication Sig Dispense Refill  . fexofenadine (ALLEGRA) 180 MG tablet Take 1 tablet (180 mg total) by mouth daily. 90 tablet 3  . finasteride (PROSCAR) 5 MG tablet Take 1 tablet (5 mg total) by mouth daily. 90 tablet 3  . levothyroxine (SYNTHROID, LEVOTHROID) 112 MCG tablet TAKE 1 TABLET BY MOUTH EVERY DAY 30 tablet 0  . metoprolol (LOPRESSOR) 50 MG tablet TAKE 1 TABLET BY MOUTH  DAILY BEFORE BREAKFAST 90 tablet 3  . NIASPAN 1000 MG CR tablet Take 1 tablet (1,000 mg total) by mouth at bedtime. 90 tablet 3  . olmesartan (BENICAR) 20 MG tablet Take 1 tablet (20 mg total) by mouth at bedtime. 90 tablet 3  . rosuvastatin (CRESTOR) 10 MG tablet Take 1 tablet (10 mg total) by mouth at bedtime. 90 tablet 3   Facility-Administered Medications Prior to Visit  Medication Dose Route Frequency Provider Last Rate Last Dose  . opium-belladonna (B&O SUPPRETTES) suppository 1 suppository  1 suppository Rectal Once Carolan Clines, MD        ROS Review of Systems  Constitutional: Negative.  Negative for chills, fatigue and fever.  HENT: Negative for congestion, postnasal drip, sinus pain and trouble swallowing.   Eyes: Negative for photophobia and visual disturbance.  Respiratory: Negative for cough, shortness of breath and wheezing.   Cardiovascular: Negative for chest pain and  palpitations.  Gastrointestinal: Negative.   Musculoskeletal: Negative for arthralgias and myalgias.  Skin: Negative for color change and rash.  Neurological: Positive for dizziness. Negative for speech difficulty, weakness, numbness and headaches.  Hematological: Does not bruise/bleed easily.  Psychiatric/Behavioral: Negative.     Objective:  BP 130/80 (BP Location: Left Arm, Patient Position: Sitting, Cuff Size: Normal)   Pulse (!) 55   Temp 98.2 F (36.8 C) (Oral)   Ht 5\' 4"  (1.626 m)   Wt 154 lb 6 oz (70 kg)   SpO2 98%   BMI 26.50 kg/m   BP Readings from Last 3 Encounters:  09/03/17 130/80  03/09/17 112/66  12/28/16 114/76    Wt Readings from Last 3 Encounters:  09/03/17 154 lb 6 oz (70 kg)  03/09/17 157 lb 4 oz (71.3 kg)  12/28/16 154 lb (69.9 kg)    Physical Exam  Constitutional: He is oriented to person, place, and time. He appears well-developed and well-nourished. No distress.  HENT:  Head: Normocephalic and atraumatic.  Right Ear: External ear normal.  Left Ear: External ear normal.  Mouth/Throat: Oropharynx is clear and moist. No oropharyngeal exudate.  Eyes: Conjunctivae and EOM are normal. Pupils are equal, round, and reactive to light. Right eye exhibits no discharge. Left eye exhibits no discharge. No scleral icterus.  Neck: Neck supple. No JVD present. No tracheal deviation present. No thyromegaly present.  Cardiovascular: Normal rate, regular rhythm and normal heart sounds.  Pulmonary/Chest: Effort  normal and breath sounds normal.  Abdominal: Soft. Bowel sounds are normal.  Lymphadenopathy:    He has no cervical adenopathy.  Neurological: He is alert and oriented to person, place, and time.  Skin: Skin is warm and dry. No rash noted. He is not diaphoretic. No erythema.  Psychiatric: He has a normal mood and affect. His behavior is normal.    Lab Results  Component Value Date   WBC 6.8 12/28/2016   HGB 15.2 12/28/2016   HCT 44.3 12/28/2016   PLT  203 12/28/2016   GLUCOSE 106 (H) 12/28/2016   CHOL 142 12/28/2016   CHOL 142 12/28/2016   TRIG 89 12/28/2016   TRIG 90 12/28/2016   HDL 56 12/28/2016   HDL 55 12/28/2016   LDLCALC 68 12/28/2016   ALT 12 12/28/2016   AST 21 12/28/2016   NA 139 12/28/2016   K 5.1 12/28/2016   CL 101 12/28/2016   CREATININE 1.25 12/28/2016   BUN 21 12/28/2016   CO2 23 12/28/2016   TSH 1.980 11/22/2015   PSA 1.6 12/02/2012    No results found.  Assessment & Plan:   Richard Ibarra was seen today for dizziness.  Diagnoses and all orders for this visit:  Labyrinthitis, unspecified laterality -     meclizine (ANTIVERT) 32 MG tablet; Take 1 tablet (32 mg total) by mouth 3 (three) times daily as needed.   I am having Richard Ibarra start on meclizine. I am also having him maintain his fexofenadine, finasteride, metoprolol tartrate, NIASPAN, olmesartan, rosuvastatin, and levothyroxine.   Patient was given a trial of meclizine and information with regards to labyrinthitis and the Epley maneuvers.  We discussed the fact that the metoprolol could be lowering his pulse rate.  He will discuss this with his primary care physician.  Meds ordered this encounter  Medications  . meclizine (ANTIVERT) 32 MG tablet    Sig: Take 1 tablet (32 mg total) by mouth 3 (three) times daily as needed.    Dispense:  60 tablet    Refill:  0     Follow-up: No Follow-up on file.  Richard Maw, MD

## 2017-12-08 ENCOUNTER — Telehealth: Payer: Self-pay | Admitting: Family Medicine

## 2017-12-08 DIAGNOSIS — E785 Hyperlipidemia, unspecified: Secondary | ICD-10-CM

## 2017-12-08 DIAGNOSIS — E038 Other specified hypothyroidism: Secondary | ICD-10-CM

## 2017-12-08 DIAGNOSIS — I10 Essential (primary) hypertension: Secondary | ICD-10-CM

## 2017-12-08 DIAGNOSIS — Z125 Encounter for screening for malignant neoplasm of prostate: Secondary | ICD-10-CM

## 2017-12-08 DIAGNOSIS — Z0000 Encounter for general adult medical examination without abnormal findings: Secondary | ICD-10-CM

## 2017-12-08 NOTE — Telephone Encounter (Signed)
Copied from [place] [id number]. Topic: Quick Communication - See Telephone Encounter >> Dec 08, 2017  2:16 PM Vernona Rieger wrote: CRM for notification. See Telephone encounter for: 12/08/17.  Patient is scheduled for an annual physical on 12/30/17. He asked if Dr Deborra Medina could write him an order to get his labs done at [facility]. He said she has done this in the past. He can be reached at [phone]

## 2017-12-08 NOTE — Telephone Encounter (Signed)
TA-Pt would like to have labs completed at [facility] for pre CPE/Would you like me to order FT4; PSA; Lipid;CMP; CBC; NMR Lipidprofile? Plz advise/thx dmf

## 2017-12-08 NOTE — Telephone Encounter (Signed)
Yes ok to order those labs as requested.

## 2017-12-14 NOTE — Addendum Note (Signed)
Addended by: Marrion Coy on: 12/14/2017 09:20 AM   Modules accepted: Orders

## 2017-12-14 NOTE — Telephone Encounter (Signed)
Per TA entered future orders to be collected W/I 2 weeks prior to 5.9.19 OV/PEC ok to sched lab visit for this and pt should be fasting/please advise pt to hydrate for full 24 hours prior to lab visit/thx dmf

## 2017-12-23 ENCOUNTER — Other Ambulatory Visit: Payer: Self-pay | Admitting: Family Medicine

## 2017-12-24 LAB — COMPREHENSIVE METABOLIC PANEL
ALT: 10 IU/L (ref 0–44)
AST: 19 IU/L (ref 0–40)
Albumin/Globulin Ratio: 1.6 (ref 1.2–2.2)
Albumin: 4.2 g/dL (ref 3.5–4.8)
Alkaline Phosphatase: 64 IU/L (ref 39–117)
BUN/Creatinine Ratio: 18 (ref 10–24)
BUN: 31 mg/dL — AB (ref 8–27)
Bilirubin Total: 0.5 mg/dL (ref 0.0–1.2)
CALCIUM: 9.2 mg/dL (ref 8.6–10.2)
CO2: 20 mmol/L (ref 20–29)
CREATININE: 1.73 mg/dL — AB (ref 0.76–1.27)
Chloride: 103 mmol/L (ref 96–106)
GFR calc Af Amer: 45 mL/min/{1.73_m2} — ABNORMAL LOW (ref >59)
GFR, EST NON AFRICAN AMERICAN: 39 mL/min/{1.73_m2} — AB (ref >59)
GLUCOSE: 99 mg/dL (ref 65–99)
Globulin, Total: 2.7 g/dL (ref 1.5–4.5)
POTASSIUM: 4.5 mmol/L (ref 3.5–5.2)
Sodium: 137 mmol/L (ref 134–144)
TOTAL PROTEIN: 6.9 g/dL (ref 6.0–8.5)

## 2017-12-24 LAB — NMR, LIPOPROFILE
Cholesterol, Total: 148 mg/dL (ref 100–199)
HDL PARTICLE NUMBER: 33.3 umol/L (ref >=30.5)
HDL-C: 50 mg/dL (ref >39)
LDL Particle Number: 769 nmol/L (ref <1000)
LDL SIZE: 20.3 nm — AB (ref >20.5)
LDL-C: 72 mg/dL (ref 0–99)
LP-IR Score: 49 — ABNORMAL HIGH (ref <=45)
SMALL LDL PARTICLE NUMBER: 362 nmol/L (ref <=527)
Triglycerides: 132 mg/dL (ref 0–149)

## 2017-12-24 LAB — CBC WITH DIFFERENTIAL/PLATELET
Basophils Absolute: 0 10*3/uL (ref 0.0–0.2)
Basos: 0 %
EOS (ABSOLUTE): 0.6 10*3/uL — AB (ref 0.0–0.4)
EOS: 8 %
Hematocrit: 40.7 % (ref 37.5–51.0)
Hemoglobin: 14.1 g/dL (ref 13.0–17.7)
Immature Grans (Abs): 0 10*3/uL (ref 0.0–0.1)
Immature Granulocytes: 0 %
LYMPHS ABS: 1.8 10*3/uL (ref 0.7–3.1)
Lymphs: 24 %
MCH: 31 pg (ref 26.6–33.0)
MCHC: 34.6 g/dL (ref 31.5–35.7)
MCV: 90 fL (ref 79–97)
MONOS ABS: 1 10*3/uL — AB (ref 0.1–0.9)
Monocytes: 14 %
Neutrophils Absolute: 4.2 10*3/uL (ref 1.4–7.0)
Neutrophils: 54 %
PLATELETS: 211 10*3/uL (ref 150–379)
RBC: 4.55 x10E6/uL (ref 4.14–5.80)
RDW: 13.8 % (ref 12.3–15.4)
WBC: 7.7 10*3/uL (ref 3.4–10.8)

## 2017-12-24 LAB — LIPID PANEL
Chol/HDL Ratio: 2.8 ratio (ref 0.0–5.0)
Cholesterol, Total: 140 mg/dL (ref 100–199)
HDL: 50 mg/dL (ref >39)
LDL CALC: 65 mg/dL (ref 0–99)
TRIGLYCERIDES: 125 mg/dL (ref 0–149)
VLDL CHOLESTEROL CAL: 25 mg/dL (ref 5–40)

## 2017-12-24 LAB — T4, FREE: Free T4: 1.73 ng/dL (ref 0.82–1.77)

## 2017-12-24 LAB — PSA: Prostate Specific Ag, Serum: 1.3 ng/mL (ref 0.0–4.0)

## 2017-12-28 DIAGNOSIS — N400 Enlarged prostate without lower urinary tract symptoms: Secondary | ICD-10-CM | POA: Diagnosis not present

## 2017-12-28 DIAGNOSIS — R972 Elevated prostate specific antigen [PSA]: Secondary | ICD-10-CM | POA: Diagnosis not present

## 2017-12-30 ENCOUNTER — Telehealth: Payer: Self-pay | Admitting: Family Medicine

## 2017-12-30 ENCOUNTER — Ambulatory Visit (INDEPENDENT_AMBULATORY_CARE_PROVIDER_SITE_OTHER): Payer: BLUE CROSS/BLUE SHIELD | Admitting: Family Medicine

## 2017-12-30 ENCOUNTER — Encounter: Payer: Self-pay | Admitting: Family Medicine

## 2017-12-30 VITALS — BP 120/78 | HR 54 | Temp 98.1°F | Ht 66.0 in | Wt 154.4 lb

## 2017-12-30 DIAGNOSIS — Z0000 Encounter for general adult medical examination without abnormal findings: Secondary | ICD-10-CM

## 2017-12-30 DIAGNOSIS — E02 Subclinical iodine-deficiency hypothyroidism: Secondary | ICD-10-CM | POA: Diagnosis not present

## 2017-12-30 DIAGNOSIS — I10 Essential (primary) hypertension: Secondary | ICD-10-CM | POA: Diagnosis not present

## 2017-12-30 DIAGNOSIS — E782 Mixed hyperlipidemia: Secondary | ICD-10-CM

## 2017-12-30 DIAGNOSIS — N289 Disorder of kidney and ureter, unspecified: Secondary | ICD-10-CM | POA: Insufficient documentation

## 2017-12-30 MED ORDER — METOPROLOL SUCCINATE ER 25 MG PO TB24: 25.0000 mg | ORAL_TABLET | Freq: Every day | ORAL | 3 refills | Status: DC

## 2017-12-30 MED ORDER — FINASTERIDE 5 MG PO TABS: 5.0000 mg | ORAL_TABLET | Freq: Every day | ORAL | 3 refills | Status: DC

## 2017-12-30 MED ORDER — METOPROLOL TARTRATE 50 MG PO TABS: ORAL_TABLET | ORAL | 3 refills | Status: DC

## 2017-12-30 MED ORDER — OLMESARTAN MEDOXOMIL-HCTZ 20-12.5 MG PO TABS: 1.0000 | ORAL_TABLET | Freq: Every day | ORAL | 3 refills | Status: DC

## 2017-12-30 MED ORDER — OLMESARTAN MEDOXOMIL 20 MG PO TABS: 20.0000 mg | ORAL_TABLET | Freq: Every day | ORAL | 3 refills | Status: DC

## 2017-12-30 MED ORDER — LEVOTHYROXINE SODIUM 112 MCG PO TABS: 112.0000 ug | ORAL_TABLET | Freq: Every day | ORAL | Status: DC

## 2017-12-30 MED ORDER — ROSUVASTATIN CALCIUM 10 MG PO TABS: 10.0000 mg | ORAL_TABLET | Freq: Every day | ORAL | 3 refills | Status: DC

## 2017-12-30 MED ORDER — FEXOFENADINE HCL 180 MG PO TABS: 180.0000 mg | ORAL_TABLET | Freq: Every day | ORAL | 3 refills | Status: DC

## 2017-12-30 MED ORDER — NIASPAN 1000 MG PO TBCR: 1000.0000 mg | EXTENDED_RELEASE_TABLET | Freq: Every day | ORAL | 3 refills | Status: DC

## 2017-12-30 NOTE — Assessment & Plan Note (Signed)
Clinically euthyroid. No changes made to rxs.

## 2017-12-30 NOTE — Assessment & Plan Note (Signed)
Push fluids, repeat renal function in 3-4 weeks (order given for pt to take to [facility]). We discussed that if his BUN and Cr remain high, we will d/c HCTZ. The patient indicates understanding of these issues and agrees with the plan.

## 2017-12-30 NOTE — Assessment & Plan Note (Signed)
Well controlled with current rxs. No changes made. 

## 2017-12-30 NOTE — Assessment & Plan Note (Signed)
No changes made today. He is normotensive.

## 2017-12-30 NOTE — Telephone Encounter (Signed)
Copied from [place] [id number]. Topic: Quick Communication - See Telephone Encounter >> Dec 30, 2017  9:04 AM Boyd Kerbs wrote: CRM for notification. See Telephone encounter for: 12/30/17.  Shane from [facility] calling about 2 prescriptions.  Question if should be filling 2 prescriptions for each medications.  Please call and clarify  olmesartan-hydrochlorothiazide (BENICAR HCT) 20-12.5 MG tablet olmesartan (BENICAR) 20 MG tablet  metoprolol tartrate (LOPRESSOR) 50 MG tablet metoprolol succinate (TOPROL-XL) 25 MG 24 hr tablet

## 2017-12-30 NOTE — Assessment & Plan Note (Signed)
Reviewed preventive care protocols, scheduled due services, and updated immunizations Discussed nutrition, exercise, diet, and healthy lifestyle.  No orders of the defined types were placed in this encounter.

## 2017-12-30 NOTE — Progress Notes (Signed)
Subjective:    Patient ID: Richard Ibarra, male    DOB: Aug 29, 1945, 72 y.o.   MRN: [id number]  HPI  72 yo pleasant male here for CPX and to review chronic medical conditions.    Health Maintenance  Topic Date Due  . Hepatitis C Screening  1945/09/15  . Fecal DNA (Cologuard)  10/04/1995  . INFLUENZA VACCINE  03/24/2018  . TETANUS/TDAP  11/03/2021  . PNA vac Low Risk Adult  Completed    Saw Dr. Evorn Gong (dermatology)-.  Follows up every 9 months now due to history of BCC.  Renal insuffiencey  Seeing urology, saw Dr. Consuella Lose on Tuesday.  He was already aware of elevated Cr at that time and since BUN elevated as well, per pt, he was advised to drink more water.  Lab Results  Component Value Date   PSA 1.6 12/02/2012   PSA 2.3 09/23/2007   PSA 00 01/14/2007    Lab Results  Component Value Date   CREATININE 1.73 (H) 12/23/2017     HTN-  On Benicar 20 mg and Metroprolol 50 mg daily.  BP stable.  Lab Results  Component Value Date   CREATININE 1.73 (H) 12/23/2017   HLD-  On niaspan 1000 mg daily and Crestor 10 mg daily.  Well controlled  Lab Results  Component Value Date   CHOL 140 12/23/2017   HDL 50 12/23/2017   LDLCALC 65 12/23/2017   TRIG 125 12/23/2017   CHOLHDL 2.8 12/23/2017    Hypothyroidism- Remains on synthroid 112 mcg daily. Lab Results  Component Value Date   TSH 1.980 11/22/2015    Pt denies any symptoms of hypo or hyperthyroidism.  Still has not had a colonoscopy.  He is willing to do cologuard.   Patient Active Problem List   Diagnosis Date Noted  . Renal insufficiency 12/30/2017  . Labyrinthitis 09/03/2017  . HLD (hyperlipidemia) 12/24/2014  . Elevated serum creatinine 12/19/2013  . Routine general medical examination at a health care facility 11/04/2011  . Hypothyroidism 01/12/2007  . Essential hypertension 01/12/2007  . ALLERGIC RHINITIS 01/12/2007   Past Medical History:  Diagnosis Date  . Allergy   . BCC (basal cell carcinoma of  skin)   . Chronic kidney disease    Elevated Creatine  . Gross hematuria 10-30-11   story consistent with exercise induced, eval by uro with 1.9cm stone L kidney  . Hypertension   . Hypothyroidism   . Seasonal allergies 11-02-11   Tx. Allegra daily  . Thyroid disease    Past Surgical History:  Procedure Laterality Date  . CYSTOSCOPY  11/09/2011   Procedure: CYSTOSCOPY;  Surgeon: Ailene Rud, MD;  Location: [facility];  Service: Urology;  Laterality: N/A;  . INGUINAL HERNIA REPAIR Right 02/14/2015   Procedure: RIGHT INGUINAL HERNIA REPAIR WITH MESH;  Surgeon: Erroll Luna, MD;  Location: [place];  Service: General;  Laterality: Right;  . INSERTION OF MESH Right 02/14/2015   Procedure: INSERTION OF MESH;  Surgeon: Erroll Luna, MD;  Location: [place];  Service: General;  Laterality: Right;  . NO PAST SURGERIES    . PROSTATE BIOPSY  01/29/2006   Dr. Gaynelle Arabian  . rash testing  02/17/1989   multiple positives   Social History   Tobacco Use  . Smoking status: Never Smoker  . Smokeless tobacco: Never Used  Substance Use Topics  . Alcohol use: Not on file  . Drug use: Not on file   Family History  Problem Relation  Age of Onset  . Cerebral aneurysm Father    Allergies  Allergen Reactions  . Banana Swelling  . Food Swelling    All melons    Current Outpatient Medications on File Prior to Visit  Medication Sig Dispense Refill  . fexofenadine (ALLEGRA) 180 MG tablet Take 1 tablet (180 mg total) by mouth daily. 90 tablet 3  . finasteride (PROSCAR) 5 MG tablet Take 1 tablet (5 mg total) by mouth daily. 90 tablet 3  . levothyroxine (SYNTHROID, LEVOTHROID) 112 MCG tablet TAKE 1 TABLET BY MOUTH EVERY DAY 30 tablet 0  . metoprolol (LOPRESSOR) 50 MG tablet TAKE 1 TABLET BY MOUTH  DAILY BEFORE BREAKFAST 90 tablet 3  . metoprolol succinate (TOPROL-XL) 25 MG 24 hr tablet Take by mouth.    Marland Kitchen NIASPAN 1000 MG CR tablet Take 1 tablet (1,000 mg total)  by mouth at bedtime. 90 tablet 3  . olmesartan (BENICAR) 20 MG tablet Take 1 tablet (20 mg total) by mouth at bedtime. 90 tablet 3  . olmesartan-hydrochlorothiazide (BENICAR HCT) 20-12.5 MG tablet Take by mouth.    . rosuvastatin (CRESTOR) 10 MG tablet Take 1 tablet (10 mg total) by mouth at bedtime. 90 tablet 3  . meclizine (ANTIVERT) 32 MG tablet Take 1 tablet (32 mg total) by mouth 3 (three) times daily as needed. (Patient not taking: Reported on 12/30/2017) 60 tablet 0   Current Facility-Administered Medications on File Prior to Visit  Medication Dose Route Frequency Provider Last Rate Last Dose  . opium-belladonna (B&O SUPPRETTES) suppository 1 suppository  1 suppository Rectal Once Carolan Clines, MD       The PMH, PSH, Social History, Family History, Medications, and allergies have been reviewed in [facility], and have been updated if relevant.    Review of Systems  Constitutional: Negative.   HENT: Negative.   Eyes: Negative.   Respiratory: Negative.   Cardiovascular: Negative.   Gastrointestinal: Negative.   Genitourinary: Negative.   Musculoskeletal: Negative.   Skin: Negative.   Allergic/Immunologic: Negative.   Neurological: Negative.   Hematological: Negative.   Psychiatric/Behavioral: Negative.       Objective:   Physical Exam BP 120/78 (BP Location: Left Arm, Patient Position: Sitting, Cuff Size: Normal)   Pulse (!) 54   Temp 98.1 F (36.7 C) (Oral)   Ht 5\' 6"  (1.676 m)   Wt 154 lb 6.4 oz (70 kg)   SpO2 97%   BMI 24.92 kg/m   General:  pleasant male in no acute distress Eyes:  PERRL Ears:  External ear exam shows no significant lesions or deformities.  TMs normal bilaterally Hearing is grossly normal bilaterally. Nose:  External nasal examination shows no deformity or inflammation. Nasal mucosa are pink and moist without lesions or exudates. Mouth:  Oral mucosa and oropharynx without lesions or exudates.  Teeth in good repair. Neck:  no carotid bruit or  thyromegaly no cervical or supraclavicular lymphadenopathy  Lungs:  Normal respiratory effort, chest expands symmetrically. Lungs are clear to auscultation, no crackles or wheezes. Heart:  Normal rate and regular rhythm. S1 and S2 normal without gallop, murmur, click, rub or other extra sounds. Abdomen:  Bowel sounds positive,abdomen soft and non-tender without masses, organomegaly or hernias noted. Pulses:  R and L posterior tibial pulses are full and equal bilaterally  Extremities:  no edema  Psych:  Good eye contact, not anxious or depressed appearing      Assessment & Plan:

## 2017-12-30 NOTE — Patient Instructions (Addendum)
Great to see you.  Please inquire about how I can order cologuard for you through [facility].  Please have your renal function within the next month.

## 2017-12-31 NOTE — Telephone Encounter (Signed)
Faxed confirmation over to [facility] at pharmacy. TLG

## 2018-04-17 ENCOUNTER — Other Ambulatory Visit: Payer: Self-pay | Admitting: Family Medicine

## 2018-04-19 DIAGNOSIS — D225 Melanocytic nevi of trunk: Secondary | ICD-10-CM | POA: Diagnosis not present

## 2018-04-19 DIAGNOSIS — D2261 Melanocytic nevi of right upper limb, including shoulder: Secondary | ICD-10-CM | POA: Diagnosis not present

## 2018-04-19 DIAGNOSIS — Z85828 Personal history of other malignant neoplasm of skin: Secondary | ICD-10-CM | POA: Diagnosis not present

## 2018-04-19 DIAGNOSIS — X32XXXA Exposure to sunlight, initial encounter: Secondary | ICD-10-CM | POA: Diagnosis not present

## 2018-04-19 DIAGNOSIS — D485 Neoplasm of uncertain behavior of skin: Secondary | ICD-10-CM | POA: Diagnosis not present

## 2018-04-19 DIAGNOSIS — C44212 Basal cell carcinoma of skin of right ear and external auricular canal: Secondary | ICD-10-CM | POA: Diagnosis not present

## 2018-05-27 DIAGNOSIS — R42 Dizziness and giddiness: Secondary | ICD-10-CM | POA: Diagnosis not present

## 2018-06-06 DIAGNOSIS — R42 Dizziness and giddiness: Secondary | ICD-10-CM | POA: Diagnosis not present

## 2018-06-07 ENCOUNTER — Encounter: Payer: Self-pay | Admitting: Family Medicine

## 2018-06-07 ENCOUNTER — Ambulatory Visit: Payer: BLUE CROSS/BLUE SHIELD | Admitting: Family Medicine

## 2018-06-07 VITALS — BP 114/74 | HR 71 | Temp 98.5°F | Ht 66.0 in | Wt 159.4 lb

## 2018-06-07 DIAGNOSIS — R053 Chronic cough: Secondary | ICD-10-CM | POA: Insufficient documentation

## 2018-06-07 DIAGNOSIS — Z23 Encounter for immunization: Secondary | ICD-10-CM

## 2018-06-07 DIAGNOSIS — R05 Cough: Secondary | ICD-10-CM

## 2018-06-07 MED ORDER — MECLIZINE HCL 25 MG PO TABS: 25.0000 mg | ORAL_TABLET | Freq: Three times a day (TID) | ORAL | 0 refills | Status: DC | PRN

## 2018-06-07 MED ORDER — PROMETHAZINE-DM 6.25-15 MG/5ML PO SYRP: 5.0000 mL | ORAL_SOLUTION | Freq: Four times a day (QID) | ORAL | 0 refills | Status: DC | PRN

## 2018-06-07 NOTE — Progress Notes (Signed)
Subjective:   Patient ID: Richard Ibarra, male    DOB: Jan 13, 1946, 72 y.o.   MRN: [id number]  Richard Ibarra is a pleasant 72 y.o. year old male who presents to clinic today with Cough (Patient is here today C/O an ongoing cough.  He states that he went to [place] and came back with a virus and has had this cough for about a month.  Cough is intermittently productive with clear sputum.  Has post-nasal drip, ears are popping and have pressure.  Tx with Guafenesin & Dextromethorphan OTC but Sx persist. )  on 06/07/2018  HPI:   Persistent URI symptoms- Went to [place] over a month ago.   Came back with what he felt was a viral URI.  Cough has persisted- intermittently productive of clear sputum.  Has PND and ears popping and feel full.  Has tried OTC gaufenesin and Dextromethorphan without improvement of symptoms.  No fever.  Current Outpatient Medications on File Prior to Visit  Medication Sig Dispense Refill  . fexofenadine (ALLEGRA) 180 MG tablet Take 1 tablet (180 mg total) by mouth daily. 90 tablet 3  . finasteride (PROSCAR) 5 MG tablet Take 1 tablet (5 mg total) by mouth daily. 90 tablet 3  . levothyroxine (SYNTHROID, LEVOTHROID) 112 MCG tablet Take 1 tablet (112 mcg total) by mouth daily. 90 tablet 03  . meclizine (ANTIVERT) 32 MG tablet Take 1 tablet (32 mg total) by mouth 3 (three) times daily as needed. 60 tablet 0  . metoprolol succinate (TOPROL-XL) 25 MG 24 hr tablet Take 1 tablet (25 mg total) by mouth daily. 90 tablet 3  . niacin (NIASPAN) 1000 MG CR tablet TAKE 1 TABLET(1000 MG) BY MOUTH AT BEDTIME 90 tablet 0  . olmesartan (BENICAR) 20 MG tablet Take 1 tablet (20 mg total) by mouth at bedtime. 90 tablet 3  . rosuvastatin (CRESTOR) 10 MG tablet Take 1 tablet (10 mg total) by mouth at bedtime. 90 tablet 3   Current Facility-Administered Medications on File Prior to Visit  Medication Dose Route Frequency Provider Last Rate Last Dose  . opium-belladonna (B&O SUPPRETTES)  suppository 1 suppository  1 suppository Rectal Once Carolan Clines, MD        Allergies  Allergen Reactions  . Banana Swelling  . Food Swelling    All melons     Past Medical History:  Diagnosis Date  . Allergy   . BCC (basal cell carcinoma of skin)   . Chronic kidney disease    Elevated Creatine  . Gross hematuria 10-30-11   story consistent with exercise induced, eval by uro with 1.9cm stone L kidney  . Hypertension   . Hypothyroidism   . Seasonal allergies 11-02-11   Tx. Allegra daily  . Thyroid disease     Past Surgical History:  Procedure Laterality Date  . CYSTOSCOPY  11/09/2011   Procedure: CYSTOSCOPY;  Surgeon: Ailene Rud, MD;  Location: [facility];  Service: Urology;  Laterality: N/A;  . INGUINAL HERNIA REPAIR Right 02/14/2015   Procedure: RIGHT INGUINAL HERNIA REPAIR WITH MESH;  Surgeon: Erroll Luna, MD;  Location: [facility];  Service: General;  Laterality: Right;  . INSERTION OF MESH Right 02/14/2015   Procedure: INSERTION OF MESH;  Surgeon: Erroll Luna, MD;  Location: [place];  Service: General;  Laterality: Right;  . NO PAST SURGERIES    . PROSTATE BIOPSY  01/29/2006   Dr. Gaynelle Arabian  . rash testing  02/17/1989   multiple positives  Family History  Problem Relation Age of Onset  . Cerebral aneurysm Father     Social History   Socioeconomic History  . Marital status: Married    Spouse name: Not on file  . Number of children: 2  . Years of education: Not on file  . Highest education level: Not on file  Occupational History  . Occupation: Therapist, art: [employer]  Social Needs  . Financial resource strain: Not on file  . Food insecurity:    Worry: Not on file    Inability: Not on file  . Transportation needs:    Medical: Not on file    Non-medical: Not on file  Tobacco Use  . Smoking status: Never Smoker  . Smokeless tobacco: Never Used  Substance and Sexual Activity  . Alcohol  use: Not on file  . Drug use: Not on file  . Sexual activity: Not on file  Lifestyle  . Physical activity:    Days per week: Not on file    Minutes per session: Not on file  . Stress: Not on file  Relationships  . Social connections:    Talks on phone: Not on file    Gets together: Not on file    Attends religious service: Not on file    Active member of club or organization: Not on file    Attends meetings of clubs or organizations: Not on file    Relationship status: Not on file  . Intimate partner violence:    Fear of current or ex partner: Not on file    Emotionally abused: Not on file    Physically abused: Not on file    Forced sexual activity: Not on file  Other Topics Concern  . Not on file  Social History Narrative   Active in scouts   The PMH, [place], Social History, Family History, Medications, and allergies have been reviewed in [facility], and have been updated if relevant.     Review of Systems  Constitutional: Negative for fever.  HENT: Positive for congestion, ear pain and postnasal drip. Negative for sinus pressure and sinus pain.   Respiratory: Positive for cough and shortness of breath. Negative for apnea, choking, chest tightness, wheezing and stridor.   Cardiovascular: Negative.   Gastrointestinal: Negative.   Endocrine: Negative.   Genitourinary: Negative.   Musculoskeletal: Negative.   Allergic/Immunologic: Negative.   Neurological: Negative.   Hematological: Negative.   Psychiatric/Behavioral: Negative.   All other systems reviewed and are negative.      Objective:    BP 114/74 (BP Location: Left Arm, Patient Position: Sitting, Cuff Size: Normal)   Pulse 71   Temp 98.5 F (36.9 C) (Oral)   Ht 5\' 6"  (1.676 m)   Wt 159 lb 6.4 oz (72.3 kg)   SpO2 98%   BMI 25.73 kg/m    Physical Exam  Constitutional: He is oriented to person, place, and time. He appears well-developed and well-nourished. No distress.  HENT:  Head: Normocephalic and atraumatic.   Right Ear: A middle ear effusion is present.  Left Ear: A middle ear effusion is present.  Mouth/Throat: Uvula is midline, oropharynx is clear and moist and mucous membranes are normal.  Neck: Normal range of motion.  Cardiovascular: Normal rate and regular rhythm.  Pulmonary/Chest: Effort normal and breath sounds normal.  Musculoskeletal: Normal range of motion.  Neurological: He is alert and oriented to person, place, and time. No cranial nerve deficit. Coordination normal.  Skin: Skin  is warm and dry. He is not diaphoretic.  Psychiatric: He has a normal mood and affect. His behavior is normal. Judgment and thought content normal.  Nursing note and vitals reviewed.         Assessment & Plan:   Need for influenza vaccination - Plan: Flu Vaccine QUAD 6+ mos PF IM (Fluarix Quad PF)  Persistent cough for 3 weeks or longer No follow-ups on file.

## 2018-06-07 NOTE — Patient Instructions (Signed)
Great to see you.  Your exam is reassuring. I'm so glad you had a great trip.  Start promethazine DM as needed at night for cough.

## 2018-06-07 NOTE — Assessment & Plan Note (Signed)
Likely viral. Exam reassuring. Promethazine DM for severe cough- discussed sedation precautions. Call or return to clinic prn if these symptoms worsen or fail to improve as anticipated.

## 2018-07-31 ENCOUNTER — Other Ambulatory Visit: Payer: Self-pay | Admitting: Family Medicine

## 2018-08-03 DIAGNOSIS — L814 Other melanin hyperpigmentation: Secondary | ICD-10-CM | POA: Diagnosis not present

## 2018-08-03 DIAGNOSIS — L988 Other specified disorders of the skin and subcutaneous tissue: Secondary | ICD-10-CM | POA: Diagnosis not present

## 2018-08-03 DIAGNOSIS — L578 Other skin changes due to chronic exposure to nonionizing radiation: Secondary | ICD-10-CM | POA: Diagnosis not present

## 2018-08-03 DIAGNOSIS — C44212 Basal cell carcinoma of skin of right ear and external auricular canal: Secondary | ICD-10-CM | POA: Diagnosis not present

## 2018-09-20 DIAGNOSIS — Z85828 Personal history of other malignant neoplasm of skin: Secondary | ICD-10-CM | POA: Diagnosis not present

## 2018-09-20 DIAGNOSIS — D2261 Melanocytic nevi of right upper limb, including shoulder: Secondary | ICD-10-CM | POA: Diagnosis not present

## 2018-09-20 DIAGNOSIS — D2262 Melanocytic nevi of left upper limb, including shoulder: Secondary | ICD-10-CM | POA: Diagnosis not present

## 2018-09-20 DIAGNOSIS — D2272 Melanocytic nevi of left lower limb, including hip: Secondary | ICD-10-CM | POA: Diagnosis not present

## 2018-09-20 DIAGNOSIS — L570 Actinic keratosis: Secondary | ICD-10-CM | POA: Diagnosis not present

## 2018-09-20 DIAGNOSIS — X32XXXA Exposure to sunlight, initial encounter: Secondary | ICD-10-CM | POA: Diagnosis not present

## 2018-12-01 ENCOUNTER — Telehealth: Payer: Self-pay | Admitting: Family Medicine

## 2018-12-01 NOTE — Telephone Encounter (Signed)
I called patient to reschedule CPE, which I did for July but the patient still wanted to have a follow up appointment at the time of his original appt and wanted to see if we could order labs for him to be done before so we could go over on the appt date. Patient normally has labs done at [place]. He would like a call back on his cell phone if he can do this

## 2018-12-01 NOTE — Telephone Encounter (Signed)
Yes okay to order labs as he requests (same as last year).

## 2018-12-05 ENCOUNTER — Other Ambulatory Visit: Payer: Self-pay

## 2018-12-05 DIAGNOSIS — E02 Subclinical iodine-deficiency hypothyroidism: Secondary | ICD-10-CM

## 2018-12-05 DIAGNOSIS — E782 Mixed hyperlipidemia: Secondary | ICD-10-CM

## 2018-12-05 DIAGNOSIS — I10 Essential (primary) hypertension: Secondary | ICD-10-CM

## 2018-12-05 DIAGNOSIS — E038 Other specified hypothyroidism: Secondary | ICD-10-CM

## 2018-12-05 DIAGNOSIS — Z125 Encounter for screening for malignant neoplasm of prostate: Secondary | ICD-10-CM

## 2018-12-05 DIAGNOSIS — Z0000 Encounter for general adult medical examination without abnormal findings: Secondary | ICD-10-CM

## 2018-12-05 NOTE — Telephone Encounter (Signed)
Labs ordered/faxed to [facility] in [place] per pt/thx dmf

## 2018-12-05 NOTE — Progress Notes (Signed)
Per TA ok to order/orders placed for [facility]

## 2018-12-15 ENCOUNTER — Other Ambulatory Visit: Payer: Self-pay | Admitting: Family Medicine

## 2018-12-16 LAB — COMPREHENSIVE METABOLIC PANEL
ALT: 15 IU/L (ref 0–44)
AST: 29 IU/L (ref 0–40)
Albumin/Globulin Ratio: 1.7 (ref 1.2–2.2)
Albumin: 4.7 g/dL (ref 3.7–4.7)
Alkaline Phosphatase: 74 IU/L (ref 39–117)
BUN/Creatinine Ratio: 17 (ref 10–24)
BUN: 20 mg/dL (ref 8–27)
Bilirubin Total: 0.6 mg/dL (ref 0.0–1.2)
CO2: 21 mmol/L (ref 20–29)
Calcium: 9.6 mg/dL (ref 8.6–10.2)
Chloride: 95 mmol/L — ABNORMAL LOW (ref 96–106)
Creatinine, Ser: 1.19 mg/dL (ref 0.76–1.27)
GFR calc Af Amer: 70 mL/min/{1.73_m2} (ref >59)
GFR calc non Af Amer: 60 mL/min/{1.73_m2} (ref >59)
Globulin, Total: 2.7 g/dL (ref 1.5–4.5)
Glucose: 100 mg/dL — ABNORMAL HIGH (ref 65–99)
Potassium: 4.5 mmol/L (ref 3.5–5.2)
Sodium: 133 mmol/L — ABNORMAL LOW (ref 134–144)
Total Protein: 7.4 g/dL (ref 6.0–8.5)

## 2018-12-16 LAB — CBC WITH DIFFERENTIAL/PLATELET
Basophils Absolute: 0.1 10*3/uL (ref 0.0–0.2)
Basos: 1 %
EOS (ABSOLUTE): 0.5 10*3/uL — ABNORMAL HIGH (ref 0.0–0.4)
Eos: 7 %
Hematocrit: 46.8 % (ref 37.5–51.0)
Hemoglobin: 16.1 g/dL (ref 13.0–17.7)
Immature Grans (Abs): 0 10*3/uL (ref 0.0–0.1)
Immature Granulocytes: 0 %
Lymphocytes Absolute: 2 10*3/uL (ref 0.7–3.1)
Lymphs: 26 %
MCH: 30.7 pg (ref 26.6–33.0)
MCHC: 34.4 g/dL (ref 31.5–35.7)
MCV: 89 fL (ref 79–97)
Monocytes Absolute: 1 10*3/uL — ABNORMAL HIGH (ref 0.1–0.9)
Monocytes: 13 %
Neutrophils Absolute: 4.1 10*3/uL (ref 1.4–7.0)
Neutrophils: 53 %
Platelets: 205 10*3/uL (ref 150–450)
RBC: 5.25 x10E6/uL (ref 4.14–5.80)
RDW: 13.8 % (ref 11.6–15.4)
WBC: 7.7 10*3/uL (ref 3.4–10.8)

## 2018-12-16 LAB — LIPID PANEL
Chol/HDL Ratio: 2.7 ratio (ref 0.0–5.0)
Cholesterol, Total: 155 mg/dL (ref 100–199)
HDL: 57 mg/dL (ref >39)
LDL Calculated: 67 mg/dL (ref 0–99)
Triglycerides: 153 mg/dL — ABNORMAL HIGH (ref 0–149)
VLDL Cholesterol Cal: 31 mg/dL (ref 5–40)

## 2018-12-16 LAB — LIPOPROTEIN ANALYSIS BY NMR
HDL Particle Number: 38.3 umol/L (ref >=30.5)
LDL Particle Number: 837 nmol/L (ref <1000)
LDL Size: 20.4 nm — ABNORMAL LOW (ref >20.5)
LP-IR Score: 41 (ref <=45)
Small LDL Particle Number: 320 nmol/L (ref <=527)

## 2018-12-16 LAB — T4, FREE: Free T4: 1.68 ng/dL (ref 0.82–1.77)

## 2018-12-16 LAB — PSA: Prostate Specific Ag, Serum: 1.6 ng/mL (ref 0.0–4.0)

## 2019-01-02 ENCOUNTER — Ambulatory Visit: Payer: BLUE CROSS/BLUE SHIELD | Admitting: Family Medicine

## 2019-01-02 ENCOUNTER — Encounter: Payer: BLUE CROSS/BLUE SHIELD | Admitting: Family Medicine

## 2019-01-02 NOTE — Progress Notes (Deleted)
Virtual Visit via Video   Due to the COVID-19 pandemic, this visit was completed with telemedicine (audio/video) technology to reduce patient and provider exposure as well as to preserve personal protective equipment.   I connected with Richard Ibarra by a video enabled telemedicine application and verified that I am speaking with the correct person using two identifiers. Location patient: Home Location provider: [facility], Office Persons participating in the virtual visit: Lorenda Ishihara, MD   I discussed the limitations of evaluation and management by telemedicine and the availability of in person appointments. The patient expressed understanding and agreed to proceed.  Care Team   Patient Care Team: Lucille Passy, MD as PCP - General (Family Medicine)  Subjective:   HPI:   ROS   Patient Active Problem List   Diagnosis Date Noted  . Persistent cough for 3 weeks or longer 06/07/2018  . Renal insufficiency 12/30/2017  . HLD (hyperlipidemia) 12/24/2014  . Elevated serum creatinine 12/19/2013  . Routine general medical examination at a health care facility 11/04/2011  . Hypothyroidism 01/12/2007  . Essential hypertension 01/12/2007  . ALLERGIC RHINITIS 01/12/2007    Social History   Tobacco Use  . Smoking status: Never Smoker  . Smokeless tobacco: Never Used  Substance Use Topics  . Alcohol use: Not on file    Current Outpatient Medications:  .  fexofenadine (ALLEGRA) 180 MG tablet, Take 1 tablet (180 mg total) by mouth daily., Disp: 90 tablet, Rfl: 3 .  finasteride (PROSCAR) 5 MG tablet, Take 1 tablet (5 mg total) by mouth daily., Disp: 90 tablet, Rfl: 3 .  levothyroxine (SYNTHROID, LEVOTHROID) 112 MCG tablet, Take 1 tablet (112 mcg total) by mouth daily., Disp: 90 tablet, Rfl: 03 .  meclizine (ANTIVERT) 25 MG tablet, Take 1 tablet (25 mg total) by mouth 3 (three) times daily as needed for dizziness., Disp: 30 tablet, Rfl: 0 .  metoprolol succinate  (TOPROL-XL) 25 MG 24 hr tablet, Take 1 tablet (25 mg total) by mouth daily., Disp: 90 tablet, Rfl: 3 .  niacin (NIASPAN) 1000 MG CR tablet, TAKE 1 TABLET(1000 MG) BY MOUTH AT BEDTIME, Disp: 90 tablet, Rfl: 0 .  niacin (NIASPAN) 1000 MG CR tablet, TAKE 1 TABLET(1000 MG) BY MOUTH AT BEDTIME, Disp: 90 tablet, Rfl: 2 .  olmesartan (BENICAR) 20 MG tablet, Take 1 tablet (20 mg total) by mouth at bedtime., Disp: 90 tablet, Rfl: 3 .  promethazine-dextromethorphan (PROMETHAZINE-DM) 6.25-15 MG/5ML syrup, Take 5 mLs by mouth 4 (four) times daily as needed., Disp: 118 mL, Rfl: 0 .  rosuvastatin (CRESTOR) 10 MG tablet, Take 1 tablet (10 mg total) by mouth at bedtime., Disp: 90 tablet, Rfl: 3 No current facility-administered medications for this visit.   Facility-Administered Medications Ordered in Other Visits:  .  opium-belladonna (B&O SUPPRETTES) suppository 1 suppository, 1 suppository, Rectal, Once, Carolan Clines, MD  Allergies  Allergen Reactions  . Banana Swelling  . Food Swelling    All melons     Objective:  There were no vitals taken for this visit.  VITALS: Per patient if applicable, see vitals. GENERAL: Alert, appears well and in no acute distress. HEENT: Atraumatic, conjunctiva clear, no obvious abnormalities on inspection of external nose and ears. NECK: Normal movements of the head and neck. CARDIOPULMONARY: No increased WOB. Speaking in clear sentences. I:E ratio WNL.  MS: Moves all visible extremities without noticeable abnormality. PSYCH: Pleasant and cooperative, well-groomed. Speech normal rate and rhythm. Affect is appropriate. Insight and judgement  are appropriate. Attention is focused, linear, and appropriate.  NEURO: CN grossly intact. Oriented as arrived to appointment on time with no prompting. Moves both UE equally.  SKIN: No obvious lesions, wounds, erythema, or cyanosis noted on face or hands.  Depression screen [facility] 2/9 12/30/2017 12/28/2016  Decreased Interest 0 0   Down, Depressed, Hopeless 0 0  PHQ - 2 Score 0 0    Assessment and Plan:   There are no diagnoses linked to this encounter.  [facility]land [facility] COVID-19 Education: The signs and symptoms of COVID-19 were discussed with the patient and how to seek care for testing if needed. The importance of social distancing was discussed today. . Reviewed expectations re: course of current medical issues. . Discussed self-management of symptoms. . Outlined signs and symptoms indicating need for more acute intervention. . Patient verbalized understanding and all questions were answered. Marland Kitchen Health Maintenance issues including appropriate healthy diet, exercise, and smoking avoidance were discussed with patient. . See orders for this visit as documented in the electronic medical record.  Arnette Norris, MD  Records requested if needed. Time spent: *** minutes, of which >50% was spent in obtaining information about his symptoms, reviewing his previous labs, evaluations, and treatments, counseling him about his condition (please see the discussed topics above), and developing a plan to further investigate it; he had a number of questions which I addressed.

## 2019-01-04 ENCOUNTER — Encounter: Payer: Self-pay | Admitting: Family Medicine

## 2019-01-04 ENCOUNTER — Ambulatory Visit (INDEPENDENT_AMBULATORY_CARE_PROVIDER_SITE_OTHER): Payer: BLUE CROSS/BLUE SHIELD | Admitting: Family Medicine

## 2019-01-04 VITALS — BP 132/68 | HR 67 | Temp 97.8°F | Ht 66.0 in | Wt 152.4 lb

## 2019-01-04 DIAGNOSIS — E782 Mixed hyperlipidemia: Secondary | ICD-10-CM

## 2019-01-04 DIAGNOSIS — E02 Subclinical iodine-deficiency hypothyroidism: Secondary | ICD-10-CM

## 2019-01-04 DIAGNOSIS — Z0000 Encounter for general adult medical examination without abnormal findings: Secondary | ICD-10-CM | POA: Diagnosis not present

## 2019-01-04 DIAGNOSIS — I10 Essential (primary) hypertension: Secondary | ICD-10-CM

## 2019-01-04 DIAGNOSIS — N289 Disorder of kidney and ureter, unspecified: Secondary | ICD-10-CM

## 2019-01-04 MED ORDER — ROSUVASTATIN CALCIUM 10 MG PO TABS: 10.0000 mg | ORAL_TABLET | Freq: Every day | ORAL | 3 refills | Status: DC

## 2019-01-04 MED ORDER — FINASTERIDE 5 MG PO TABS: 5.0000 mg | ORAL_TABLET | Freq: Every day | ORAL | 3 refills | Status: DC

## 2019-01-04 MED ORDER — OLMESARTAN MEDOXOMIL 20 MG PO TABS: 20.0000 mg | ORAL_TABLET | Freq: Every day | ORAL | 3 refills | Status: DC

## 2019-01-04 MED ORDER — LEVOTHYROXINE SODIUM 112 MCG PO TABS: 112.0000 ug | ORAL_TABLET | Freq: Every day | ORAL | 3 refills | Status: DC

## 2019-01-04 NOTE — Assessment & Plan Note (Signed)
Cr and GFR look great.

## 2019-01-04 NOTE — Patient Instructions (Signed)
1.) Please complete Cologuard screening at home and send back to the company :)  2.)  Please call your insurance company and ask them "At what percentage do you cover the Shingrix Vaccine in the office?"  If it is appropriate then please call the office to schedule a nurse visit to have this administered :)

## 2019-01-04 NOTE — Assessment & Plan Note (Signed)
Well controlled on current rxs. No changes made. 

## 2019-01-04 NOTE — Progress Notes (Signed)
Subjective:    Patient ID: Richard Ibarra, male    DOB: [birth year], 73 y.o.   MRN: [id number]  HPI  73 yo pleasant male here for CPX and to review chronic medical conditions.  Doing well- retired two weeks ago.  Health Maintenance  Topic Date Due  . Fecal DNA (Cologuard)  10/04/1995  . Hepatitis C Screening  06/08/2019 (Originally 1946/01/09)  . INFLUENZA VACCINE  03/25/2019  . TETANUS/TDAP  11/03/2021  . PNA vac Low Risk Adult  Completed       Health Maintenance  Topic Date Due  . Fecal DNA (Cologuard)  10/04/1995  . Hepatitis C Screening  06/08/2019 (Originally 10-15-1945)  . INFLUENZA VACCINE  03/25/2019  . TETANUS/TDAP  11/03/2021  . PNA vac Low Risk Adult  Completed  Still has not had a colonoscopy.  He is willing to do cologuard.  He has the cologuard screening kit at home and will follow the instructions and send to the company. He is UTD on immunizations.   Saw Dr. Evorn Gong (dermatology)-.  Follows up every 9 months now due to history of BCC.  Renal insuffiencey- creatinine and GFR are better than they have been in years. Lab Results  Component Value Date   CREATININE 1.19 12/15/2018     Followed by urology, Dr. Consuella Lose- has an appointment with him tomorrow.   Lab Results  Component Value Date   PSA 1.6 12/02/2012   PSA 2.3 09/23/2007   PSA 00 01/14/2007     HTN-  On Benicar 20 mg and Metroprolol 50 mg daily.  BP stable.  Lab Results  Component Value Date   CREATININE 1.19 12/15/2018   HLD-  On niaspan 1000 mg daily and Crestor 10 mg daily.  Well controlled . Lab Results  Component Value Date   CHOL 155 12/15/2018   HDL 57 12/15/2018   LDLCALC 67 12/15/2018   TRIG 153 (H) 12/15/2018   CHOLHDL 2.7 12/15/2018    Hypothyroidism- Remains on synthroid 112 mcg daily.  Clinically euthyroid.  FT4 was 1.68 two weeks ago. Lab Results  Component Value Date   TSH 1.980 11/22/2015   Pt denies any symptoms of hypo or hyperthyroidism.     Patient  Active Problem List   Diagnosis Date Noted  . Renal insufficiency 12/30/2017  . HLD (hyperlipidemia) 12/24/2014  . Elevated serum creatinine 12/19/2013  . Routine general medical examination at a health care facility 11/04/2011  . Hypothyroidism 01/12/2007  . Essential hypertension 01/12/2007  . ALLERGIC RHINITIS 01/12/2007   Past Medical History:  Diagnosis Date  . Allergy   . BCC (basal cell carcinoma of skin)   . Chronic kidney disease    Elevated Creatine  . Gross hematuria 10-30-11   story consistent with exercise induced, eval by uro with 1.9cm stone L kidney  . Hypertension   . Hypothyroidism   . Seasonal allergies 11-02-11   Tx. Allegra daily  . Thyroid disease    Past Surgical History:  Procedure Laterality Date  . CYSTOSCOPY  11/09/2011   Procedure: CYSTOSCOPY;  Surgeon: Ailene Rud, MD;  Location: [facility];  Service: Urology;  Laterality: N/A;  . INGUINAL HERNIA REPAIR Right 02/14/2015   Procedure: RIGHT INGUINAL HERNIA REPAIR WITH MESH;  Surgeon: Erroll Luna, MD;  Location: [place];  Service: General;  Laterality: Right;  . INSERTION OF MESH Right 02/14/2015   Procedure: INSERTION OF MESH;  Surgeon: Erroll Luna, MD;  Location: [place];  Service: General;  Laterality: Right;  . NO PAST SURGERIES    . PROSTATE BIOPSY  01/29/2006   Dr. Gaynelle Arabian  . rash testing  02/17/1989   multiple positives   Social History   Tobacco Use  . Smoking status: Never Smoker  . Smokeless tobacco: Never Used  Substance Use Topics  . Alcohol use: Not on file  . Drug use: Not on file   Family History  Problem Relation Age of Onset  . Cerebral aneurysm Father    Allergies  Allergen Reactions  . Banana Swelling  . Food Swelling    All melons    Current Outpatient Medications on File Prior to Visit  Medication Sig Dispense Refill  . fexofenadine (ALLEGRA) 180 MG tablet Take 1 tablet (180 mg total) by mouth daily. 90 tablet 3  .  finasteride (PROSCAR) 5 MG tablet Take 1 tablet (5 mg total) by mouth daily. 90 tablet 3  . levothyroxine (SYNTHROID, LEVOTHROID) 112 MCG tablet Take 1 tablet (112 mcg total) by mouth daily. 90 tablet 03  . meclizine (ANTIVERT) 25 MG tablet Take 1 tablet (25 mg total) by mouth 3 (three) times daily as needed for dizziness. 30 tablet 0  . metoprolol succinate (TOPROL-XL) 25 MG 24 hr tablet Take 1 tablet (25 mg total) by mouth daily. 90 tablet 3  . niacin (NIASPAN) 1000 MG CR tablet TAKE 1 TABLET(1000 MG) BY MOUTH AT BEDTIME 90 tablet 0  . olmesartan (BENICAR) 20 MG tablet Take 1 tablet (20 mg total) by mouth at bedtime. 90 tablet 3  . rosuvastatin (CRESTOR) 10 MG tablet Take 1 tablet (10 mg total) by mouth at bedtime. 90 tablet 3   Current Facility-Administered Medications on File Prior to Visit  Medication Dose Route Frequency Provider Last Rate Last Dose  . opium-belladonna (B&O SUPPRETTES) suppository 1 suppository  1 suppository Rectal Once Carolan Clines, MD       The PMH, PSH, Social History, Family History, Medications, and allergies have been reviewed in [facility], and have been updated if relevant.    Review of Systems  Constitutional: Negative.   HENT: Negative.   Eyes: Negative.   Respiratory: Negative.   Cardiovascular: Negative.   Gastrointestinal: Negative.   Genitourinary: Negative.   Musculoskeletal: Negative.   Skin: Negative.   Allergic/Immunologic: Negative.   Neurological: Negative.   Hematological: Negative.   Psychiatric/Behavioral: Negative.       Objective:   Physical Exam BP 132/68 (BP Location: Left Arm, Patient Position: Sitting, Cuff Size: Normal)   Pulse 67   Temp 97.8 F (36.6 C) (Oral)   Ht _0  (1.676 m)   Wt 152 lb 6.4 oz (69.1 kg)   SpO2 98%   BMI 24.60 kg/m   General:  pleasant male in no acute distress Eyes:  PERRL Ears:  External ear exam shows no significant lesions or deformities.  TMs normal bilaterally Hearing is grossly normal  bilaterally. Nose:  External nasal examination shows no deformity or inflammation. Nasal mucosa are pink and moist without lesions or exudates. Mouth:  Oral mucosa and oropharynx without lesions or exudates.  Teeth in good repair. Neck:  no carotid bruit or thyromegaly no cervical or supraclavicular lymphadenopathy  Lungs:  Normal respiratory effort, chest expands symmetrically. Lungs are clear to auscultation, no crackles or wheezes. Heart:  Normal rate and regular rhythm. S1 and S2 normal without gallop, murmur, click, rub or other extra sounds. Abdomen:  Bowel sounds positive,abdomen soft and non-tender without masses, organomegaly or hernias noted. Pulses:  R  and L posterior tibial pulses are full and equal bilaterally  Extremities:  no edema  Psych:  Good eye contact, not anxious or depressed appearing       Assessment & Plan:

## 2019-01-04 NOTE — Assessment & Plan Note (Signed)
Reviewed preventive care protocols, scheduled due services, and updated immunizations Discussed nutrition, exercise, diet, and healthy lifestyle.  

## 2019-01-04 NOTE — Assessment & Plan Note (Signed)
Clinically euthyroid. No changes made to dose of synthroid.

## 2019-01-04 NOTE — Assessment & Plan Note (Signed)
Well controlled.  No changes made. 

## 2019-01-05 DIAGNOSIS — N402 Nodular prostate without lower urinary tract symptoms: Secondary | ICD-10-CM | POA: Diagnosis not present

## 2019-01-05 DIAGNOSIS — R972 Elevated prostate specific antigen [PSA]: Secondary | ICD-10-CM | POA: Diagnosis not present

## 2019-01-15 ENCOUNTER — Other Ambulatory Visit: Payer: Self-pay | Admitting: Family Medicine

## 2019-03-04 ENCOUNTER — Encounter: Payer: Self-pay | Admitting: Family Medicine

## 2019-03-06 ENCOUNTER — Emergency Department (HOSPITAL_COMMUNITY)
Admission: EM | Admit: 2019-03-06 | Discharge: 2019-03-07 | Disposition: A | Discharge status: Home / Self Care | Payer: Medicare Other | Attending: Emergency Medicine | Admitting: Emergency Medicine

## 2019-03-06 ENCOUNTER — Encounter (HOSPITAL_COMMUNITY): Payer: Self-pay | Admitting: Emergency Medicine

## 2019-03-06 ENCOUNTER — Other Ambulatory Visit: Payer: Self-pay

## 2019-03-06 ENCOUNTER — Encounter: Payer: BLUE CROSS/BLUE SHIELD | Admitting: Family Medicine

## 2019-03-06 DIAGNOSIS — N200 Calculus of kidney: Secondary | ICD-10-CM | POA: Diagnosis not present

## 2019-03-06 DIAGNOSIS — E039 Hypothyroidism, unspecified: Secondary | ICD-10-CM | POA: Diagnosis not present

## 2019-03-06 DIAGNOSIS — N189 Chronic kidney disease, unspecified: Secondary | ICD-10-CM | POA: Insufficient documentation

## 2019-03-06 DIAGNOSIS — Z85828 Personal history of other malignant neoplasm of skin: Secondary | ICD-10-CM | POA: Insufficient documentation

## 2019-03-06 DIAGNOSIS — R110 Nausea: Secondary | ICD-10-CM | POA: Insufficient documentation

## 2019-03-06 DIAGNOSIS — R1031 Right lower quadrant pain: Secondary | ICD-10-CM | POA: Diagnosis present

## 2019-03-06 DIAGNOSIS — I129 Hypertensive chronic kidney disease with stage 1 through stage 4 chronic kidney disease, or unspecified chronic kidney disease: Secondary | ICD-10-CM | POA: Diagnosis not present

## 2019-03-06 DIAGNOSIS — Z79899 Other long term (current) drug therapy: Secondary | ICD-10-CM | POA: Insufficient documentation

## 2019-03-06 MED ORDER — SODIUM CHLORIDE 0.9% FLUSH
3.0000 mL | Freq: Once | INTRAVENOUS | Status: AC
  Administered 2019-03-06: 3 mL via INTRAVENOUS

## 2019-03-06 NOTE — ED Triage Notes (Signed)
Pt reports having sudden onset of RLQ pain at appx 2200 along with nausea no vomiting.

## 2019-03-06 NOTE — ED Notes (Signed)
Pt made aware urine sample needed. Pt states he cannot void right now because he urinated before coming to the ED.

## 2019-03-07 ENCOUNTER — Encounter (HOSPITAL_COMMUNITY): Payer: Self-pay | Admitting: Emergency Medicine

## 2019-03-07 ENCOUNTER — Emergency Department (HOSPITAL_COMMUNITY): Payer: Medicare Other

## 2019-03-07 DIAGNOSIS — N200 Calculus of kidney: Secondary | ICD-10-CM | POA: Diagnosis not present

## 2019-03-07 LAB — CBC
HCT: 45.2 % (ref 39.0–52.0)
Hemoglobin: 15.2 g/dL (ref 13.0–17.0)
MCH: 30.6 pg (ref 26.0–34.0)
MCHC: 33.6 g/dL (ref 30.0–36.0)
MCV: 90.9 fL (ref 80.0–100.0)
Platelets: 220 10*3/uL (ref 150–400)
RBC: 4.97 MIL/uL (ref 4.22–5.81)
RDW: 13.3 % (ref 11.5–15.5)
WBC: 11.1 10*3/uL — ABNORMAL HIGH (ref 4.0–10.5)
nRBC: 0 % (ref 0.0–0.2)

## 2019-03-07 LAB — COMPREHENSIVE METABOLIC PANEL
ALT: 18 U/L (ref 0–44)
AST: 34 U/L (ref 15–41)
Albumin: 4.8 g/dL (ref 3.5–5.0)
Alkaline Phosphatase: 68 U/L (ref 38–126)
Anion gap: 14 (ref 5–15)
BUN: 44 mg/dL — ABNORMAL HIGH (ref 8–23)
CO2: 20 mmol/L — ABNORMAL LOW (ref 22–32)
Calcium: 10 mg/dL (ref 8.9–10.3)
Chloride: 104 mmol/L (ref 98–111)
Creatinine, Ser: 1.36 mg/dL — ABNORMAL HIGH (ref 0.61–1.24)
GFR calc Af Amer: 59 mL/min — ABNORMAL LOW (ref >60)
GFR calc non Af Amer: 51 mL/min — ABNORMAL LOW (ref >60)
Glucose, Bld: 148 mg/dL — ABNORMAL HIGH (ref 70–99)
Potassium: 4.3 mmol/L (ref 3.5–5.1)
Sodium: 138 mmol/L (ref 135–145)
Total Bilirubin: 0.4 mg/dL (ref 0.3–1.2)
Total Protein: 8.3 g/dL — ABNORMAL HIGH (ref 6.5–8.1)

## 2019-03-07 LAB — URINALYSIS, ROUTINE W REFLEX MICROSCOPIC
Bilirubin Urine: NEGATIVE
Glucose, UA: NEGATIVE mg/dL
Ketones, ur: NEGATIVE mg/dL
Leukocytes,Ua: NEGATIVE
Nitrite: NEGATIVE
Protein, ur: 100 mg/dL — AB
RBC / HPF: >50 RBC/hpf — ABNORMAL HIGH (ref 0–5)
Specific Gravity, Urine: 1.023 (ref 1.005–1.030)
pH: 6 (ref 5.0–8.0)

## 2019-03-07 MED ORDER — FENTANYL CITRATE (PF) 100 MCG/2ML IJ SOLN
50.0000 ug | Freq: Once | INTRAMUSCULAR | Status: AC
  Administered 2019-03-07: 50 ug via INTRAVENOUS
  Filled 2019-03-07: qty 2

## 2019-03-07 MED ORDER — TAMSULOSIN HCL 0.4 MG PO CAPS: 0.4000 mg | ORAL_CAPSULE | Freq: Every day | ORAL | 0 refills | Status: AC

## 2019-03-07 MED ORDER — OXYCODONE-ACETAMINOPHEN 5-325 MG PO TABS: 1.0000 | ORAL_TABLET | Freq: Four times a day (QID) | ORAL | 0 refills | Status: DC | PRN

## 2019-03-07 MED ORDER — TAMSULOSIN HCL 0.4 MG PO CAPS
0.4000 mg | ORAL_CAPSULE | ORAL | Status: AC
  Administered 2019-03-07: 0.4 mg via ORAL
  Filled 2019-03-07: qty 1

## 2019-03-07 MED ORDER — KETOROLAC TROMETHAMINE 30 MG/ML IJ SOLN
15.0000 mg | Freq: Once | INTRAMUSCULAR | Status: AC
  Administered 2019-03-07: 15 mg via INTRAVENOUS
  Filled 2019-03-07: qty 1

## 2019-03-07 MED ORDER — OXYCODONE-ACETAMINOPHEN 5-325 MG PO TABS
1.0000 | ORAL_TABLET | ORAL | Status: AC
  Administered 2019-03-07: 1 via ORAL
  Filled 2019-03-07: qty 1

## 2019-03-07 MED ORDER — ONDANSETRON HCL 4 MG/2ML IJ SOLN
4.0000 mg | Freq: Once | INTRAMUSCULAR | Status: AC
  Administered 2019-03-07: 4 mg via INTRAVENOUS
  Filled 2019-03-07: qty 2

## 2019-03-07 MED ORDER — ONDANSETRON 8 MG PO TBDP: ORAL_TABLET | ORAL | 0 refills | Status: DC

## 2019-03-07 MED ORDER — DICLOFENAC SODIUM ER 100 MG PO TB24: 100.0000 mg | ORAL_TABLET | Freq: Every day | ORAL | 0 refills | Status: DC

## 2019-03-07 MED ORDER — SODIUM CHLORIDE 0.9 % IV BOLUS
500.0000 mL | Freq: Once | INTRAVENOUS | Status: AC
  Administered 2019-03-07: 500 mL via INTRAVENOUS

## 2019-03-07 MED ORDER — MORPHINE SULFATE (PF) 4 MG/ML IV SOLN
4.0000 mg | Freq: Once | INTRAVENOUS | Status: DC
  Filled 2019-03-07: qty 1

## 2019-03-07 NOTE — ED Provider Notes (Signed)
[facility] DEPT Provider Note   CSN: [id number] Arrival date & time: 03/06/19  2324     History   Chief Complaint Chief Complaint  Patient presents with  . Abdominal Pain    HPI Richard Ibarra is a 73 y.o. male.     The history is provided by the patient.  Abdominal Pain Pain location:  RLQ Pain quality: sharp   Pain radiates to:  Does not radiate Pain severity:  Severe Onset quality:  Sudden Timing:  Constant Progression:  Unchanged Chronicity:  Recurrent Context: not alcohol use, not awakening from sleep, not diet changes, not eating and not laxative use   Relieved by:  Nothing Worsened by:  Nothing Ineffective treatments:  None tried Associated symptoms: nausea   Associated symptoms: no chest pain, no constipation, no cough, no diarrhea, no dysuria, no fever, no hematuria and no vomiting   Risk factors: not obese     Past Medical History:  Diagnosis Date  . Allergy   . BCC (basal cell carcinoma of skin)   . Chronic kidney disease    Elevated Creatine  . Gross hematuria 10-30-11   story consistent with exercise induced, eval by uro with 1.9cm stone L kidney  . Hypertension   . Hypothyroidism   . Seasonal allergies 11-02-11   Tx. [place]ra daily  . Thyroid disease     Patient Active Problem List   Diagnosis Date Noted  . Renal insufficiency 12/30/2017  . HLD (hyperlipidemia) 12/24/2014  . Elevated serum creatinine 12/19/2013  . Routine general medical examination at a health care facility 11/04/2011  . Hypothyroidism 01/12/2007  . Essential hypertension 01/12/2007  . ALLERGIC RHINITIS 01/12/2007    Past Surgical History:  Procedure Laterality Date  . CYSTOSCOPY  11/09/2011   Procedure: CYSTOSCOPY;  Surgeon: Ailene Rud, MD;  Location: [facility];  Service: Urology;  Laterality: N/A;  . INGUINAL HERNIA REPAIR Right 02/14/2015   Procedure: RIGHT INGUINAL HERNIA REPAIR WITH MESH;  Surgeon: Erroll Luna, MD;   Location: [facility];  Service: General;  Laterality: Right;  . INSERTION OF MESH Right 02/14/2015   Procedure: INSERTION OF MESH;  Surgeon: Erroll Luna, MD;  Location: [place];  Service: General;  Laterality: Right;  . NO PAST SURGERIES    . PROSTATE BIOPSY  01/29/2006   Dr. Gaynelle Arabian  . rash testing  02/17/1989   multiple positives        Home Medications    Prior to Admission medications   Medication Sig Start Date End Date Taking? Authorizing Provider  fexofenadine (ALLEGRA) 180 MG tablet Take 1 tablet (180 mg total) by mouth daily. 12/30/17   Lucille Passy, MD  finasteride (PROSCAR) 5 MG tablet Take 1 tablet (5 mg total) by mouth daily. 01/04/19   Lucille Passy, MD  levothyroxine (SYNTHROID) 112 MCG tablet Take 1 tablet (112 mcg total) by mouth daily. 01/04/19   Lucille Passy, MD  meclizine (ANTIVERT) 25 MG tablet Take 1 tablet (25 mg total) by mouth 3 (three) times daily as needed for dizziness. 06/07/18   Lucille Passy, MD  metoprolol succinate (TOPROL-XL) 25 MG 24 hr tablet TAKE 1 TABLET(25 MG) BY MOUTH DAILY 01/18/19   Lucille Passy, MD  niacin (NIASPAN) 1000 MG CR tablet TAKE 1 TABLET(1000 MG) BY MOUTH AT BEDTIME 04/18/18   Lucille Passy, MD  olmesartan (BENICAR) 20 MG tablet TAKE 1 TABLET(20 MG) BY MOUTH AT BEDTIME 01/18/19   Deborra Medina,  Marciano Sequin, MD  rosuvastatin (CRESTOR) 10 MG tablet Take 1 tablet (10 mg total) by mouth at bedtime. 01/04/19   Lucille Passy, MD    Family History Family History  Problem Relation Age of Onset  . Cerebral aneurysm Father     Social History Social History   Tobacco Use  . Smoking status: Never Smoker  . Smokeless tobacco: Never Used  Substance Use Topics  . Alcohol use: Not on file  . Drug use: Not on file     Allergies   Banana and Food   Review of Systems Review of Systems  Constitutional: Negative for fever.  Respiratory: Negative for cough.   Cardiovascular: Negative for chest pain.   Gastrointestinal: Positive for abdominal pain and nausea. Negative for constipation, diarrhea and vomiting.  Genitourinary: Negative for dysuria and hematuria.  All other systems reviewed and are negative.    Physical Exam Updated Vital Signs BP (!) 163/74 (BP Location: Left Arm)   Pulse 90   Temp 98.4 F (36.9 C) (Oral)   Resp 20   Ht 5\' 8"  (1.727 m)   Wt 68 kg   SpO2 100%   BMI 22.81 kg/m   Physical Exam Vitals signs and nursing note reviewed.  Constitutional:      General: He is not in acute distress.    Appearance: He is normal weight.  HENT:     Head: Normocephalic and atraumatic.     Nose: Nose normal.  Eyes:     Conjunctiva/sclera: Conjunctivae normal.     Pupils: Pupils are equal, round, and reactive to light.  Neck:     Musculoskeletal: Normal range of motion and neck supple.  Cardiovascular:     Rate and Rhythm: Normal rate and regular rhythm.     Pulses: Normal pulses.     Heart sounds: Normal heart sounds.  Pulmonary:     Effort: Pulmonary effort is normal.     Breath sounds: Normal breath sounds.  Abdominal:     General: Abdomen is flat. Bowel sounds are normal.     Tenderness: There is no abdominal tenderness. There is no guarding or rebound.  Musculoskeletal: Normal range of motion.  Skin:    General: Skin is warm and dry.     Capillary Refill: Capillary refill takes less than 2 seconds.  Neurological:     General: No focal deficit present.     Mental Status: He is alert and oriented to person, place, and time.  Psychiatric:        Mood and Affect: Mood normal.        Behavior: Behavior normal.      ED Treatments / Results  Labs (all labs ordered are listed, but only abnormal results are displayed) Results for orders placed or performed during the hospital encounter of 03/06/19  Comprehensive metabolic panel  Result Value Ref Range   Sodium 138 135 - 145 mmol/L   Potassium 4.3 3.5 - 5.1 mmol/L   Chloride 104 98 - 111 mmol/L   CO2 20 (L)  22 - 32 mmol/L   Glucose, Bld 148 (H) 70 - 99 mg/dL   BUN 44 (H) 8 - 23 mg/dL   Creatinine, Ser 1.36 (H) 0.61 - 1.24 mg/dL   Calcium 10.0 8.9 - 10.3 mg/dL   Total Protein 8.3 (H) 6.5 - 8.1 g/dL   Albumin 4.8 3.5 - 5.0 g/dL   AST 34 15 - 41 U/L   ALT 18 0 - 44 U/L   Alkaline  Phosphatase 68 38 - 126 U/L   Total Bilirubin 0.4 0.3 - 1.2 mg/dL   GFR calc non Af Amer 51 (L) >60 mL/min   GFR calc Af Amer 59 (L) >60 mL/min   Anion gap 14 5 - 15  CBC  Result Value Ref Range   WBC 11.1 (H) 4.0 - 10.5 K/uL   RBC 4.97 4.22 - 5.81 MIL/uL   Hemoglobin 15.2 13.0 - 17.0 g/dL   HCT 45.2 39.0 - 52.0 %   MCV 90.9 80.0 - 100.0 fL   MCH 30.6 26.0 - 34.0 pg   MCHC 33.6 30.0 - 36.0 g/dL   RDW 13.3 11.5 - 15.5 %   Platelets 220 150 - 400 K/uL   nRBC 0.0 0.0 - 0.2 %   Ct Renal Stone Study  Result Date: 03/07/2019 CLINICAL DATA:  Right lower quadrant pain with nausea EXAM: CT ABDOMEN AND PELVIS WITHOUT CONTRAST TECHNIQUE: Multidetector CT imaging of the abdomen and pelvis was performed following the standard protocol without IV contrast. COMPARISON:  CT 10/06/2011 FINDINGS: Lower chest: Lung bases demonstrate mild fibrosis in the right middle lobe. No acute consolidation or effusion. Normal heart size. Hepatobiliary: No focal liver abnormality is seen. No gallstones, gallbladder wall thickening, or biliary dilatation. Pancreas: Unremarkable. No pancreatic ductal dilatation or surrounding inflammatory changes. Spleen: Normal in size without focal abnormality. Adrenals/Urinary Tract: Adrenal glands are within normal limits. Small parapelvic cysts on the left. Probable cyst lower pole left kidney. Punctate stone lower pole right kidney. Mild right hydronephrosis and hydroureter, secondary to a 5 by 6 mm stone in the distal right ureter about a cm proximal to the right UVJ. The bladder is unremarkable Stomach/Bowel: Stomach is within normal limits. Appendix appears normal. No evidence of bowel wall thickening,  distention, or inflammatory changes. Sigmoid colon diverticula without acute inflammatory process Vascular/Lymphatic: Moderate aortic atherosclerosis. No aneurysm. No significantly enlarged lymph nodes Reproductive: Prostate calcification Other: Negative for free air or free fluid. Small fat containing umbilical hernia Musculoskeletal: Degenerative changes without acute or suspicious abnormality. Trace retrolisthesis L2 on L3. IMPRESSION: 1. Mild right hydronephrosis and hydroureter, secondary to a 5 x 6 mm stone in the distal right ureter about a cm proximal to the right UVJ 2. Punctate stone within the right kidney 3. Sigmoid colon diverticula without acute inflammatory change Electronically Signed   By: Donavan Foil M.D.   On: 03/07/2019 03:18    Radiology No results found.  Procedures Procedures (including critical care time)  Medications Ordered in ED Medications  morphine 4 MG/ML injection 4 mg (has no administration in time range)  sodium chloride flush (NS) 0.9 % injection 3 mL (3 mLs Intravenous Given 03/06/19 2358)  ketorolac (TORADOL) 30 MG/ML injection 15 mg (15 mg Intravenous Given 03/07/19 0027)  ondansetron (ZOFRAN) injection 4 mg (4 mg Intravenous Given 03/07/19 0027)  sodium chloride 0.9 % bolus 500 mL (0 mLs Intravenous Stopped 03/07/19 0056)  fentaNYL (SUBLIMAZE) injection 50 mcg (50 mcg Intravenous Given 03/07/19 0027)  tamsulosin (FLOMAX) capsule 0.4 mg (0.4 mg Oral Given 03/07/19 0406)  oxyCODONE-acetaminophen (PERCOCET/ROXICET) 5-325 MG per tablet 1 tablet (1 tablet Oral Given 03/07/19 0406)     Final Clinical Impressions(s) / ED Diagnoses   Return for intractable cough, coughing up blood,fevers >100.4 unrelieved by medication, shortness of breath, intractable vomiting, chest pain, shortness of breath, weakness,numbness, changes in speech, facial asymmetry,abdominal pain, passing out,Inability to tolerate liquids or food, cough, altered mental status or any concerns.  No signs of systemic  illness or infection. The patient is nontoxic-appearing on exam and vital signs are within normal limits.   I have reviewed the triage vital signs and the nursing notes. Pertinent labs &imaging results that were available during my care of the patient were reviewed by me and considered in my medical decision making (see chart for details).  After history, exam, and medical workup I feel the patient has been appropriately medically screened and is safe for discharge home. Pertinent diagnoses were discussed with the patient. Patient was given return precautions   Shayon Trompeter, MD 03/07/19 [id number]

## 2019-03-31 ENCOUNTER — Other Ambulatory Visit: Payer: Self-pay

## 2019-03-31 ENCOUNTER — Telehealth: Payer: Self-pay

## 2019-03-31 ENCOUNTER — Ambulatory Visit: Payer: Self-pay | Admitting: Family Medicine

## 2019-03-31 ENCOUNTER — Ambulatory Visit (HOSPITAL_BASED_OUTPATIENT_CLINIC_OR_DEPARTMENT_OTHER)
Admission: RE | Admit: 2019-03-31 | Discharge: 2019-03-31 | Disposition: A | Discharge status: Home / Self Care | Payer: Medicare Other | Source: Ambulatory Visit | Attending: Family Medicine | Admitting: Family Medicine

## 2019-03-31 DIAGNOSIS — N200 Calculus of kidney: Secondary | ICD-10-CM

## 2019-03-31 DIAGNOSIS — R109 Unspecified abdominal pain: Secondary | ICD-10-CM

## 2019-03-31 MED ORDER — IOHEXOL 300 MG/ML  SOLN
100.0000 mL | Freq: Once | INTRAMUSCULAR | Status: AC | PRN
  Administered 2019-03-31: 100 mL via INTRAVENOUS

## 2019-03-31 NOTE — Telephone Encounter (Signed)
Dr. Deborra Medina is personally calling patient/thx dmf

## 2019-03-31 NOTE — Telephone Encounter (Signed)
Pt and wife are OTW to [facility] for CT Abd/Pelvis W contrast/I called Ms. Destiny back at scheduling and advised that they are OTW from [facility] and she is taking it over from there/thx dmf

## 2019-03-31 NOTE — Telephone Encounter (Signed)
Wife, Zamir Staples called in for husband because he is having severe abd pain in his right lower quadrant of his abd for 3 days now that is getting worse.    He saw the urologist this morning and had an U/S done.  He does not have a kidney stone per wife (he is there with her) on the U/S.    He had a CT scan done in the ED 3 wks ago that showed he had a kidney stone on the right side.     I instructed him to go back to the ED since the pain is getting worse and been going on for 3 days.    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 Deborra Medina.     I called into Dr. Hulen Shouts office and spoke with Sharyn Lull   I let her know what was going on with him.   She is going to call Dr. Deborra Medina and see about getting her to call him.   She also agreed with me that he should go on to the ED.   I thanked her for her help.  I let the pt and wife know that they are going to call Dr. Deborra Medina and have her call him back.   I again let them know he needed to go to the ED.   The wife said she was going to take him back over to  [facility] ED where he went 3 wks ago.    He is taking his cell phone with him so when she calls back he can take the call.  I verified his cell number in the chart and it's correct. Rose thanked me very much for my help and said,   "I'm going to take him now".      Reason for Disposition . [1] SEVERE pain AND [2] age > 42  Answer Assessment - Initial Assessment Questions 1. LOCATION: "Where does it hurt?"      Wife calling in.   He is having severe abd pain for 3 days now.   This morning a kidney stone was ruled out at urologist office 2. RADIATION: "Does the pain shoot anywhere else?" (e.g., chest, back)     RLQ abdomin and it comes and goes   No diarrhea.   No fever. 3. ONSET: "When did the pain begin?" (Minutes, hours or days ago)      3 days ago it's gotten worse. 4. SUDDEN: "Gradual or sudden onset?"     Gradual onset that has gotten worse. 5. PATTERN "Does  the pain come and go, or is it constant?"    - If constant: "Is it getting better, staying the same, or worsening?"      (Note: Constant means the pain never goes away completely; most serious pain is constant and it progresses)     - If intermittent: "How long does it last?" "Do you have pain now?"     (Note: Intermittent means the pain goes away completely between bouts)     Comes and goes 6. SEVERITY: "How bad is the pain?"  (e.g., Scale 1-10; mild, moderate, or severe)    - MILD (1-3): doesn't interfere with normal activities, abdomen soft and not tender to touch     - MODERATE (4-7): interferes with normal activities or awakens from sleep, tender to touch     - SEVERE (8-10): excruciating pain, doubled over, unable to do any normal  activities       This is a 5 general dull pain not sharp. 7. RECURRENT SYMPTOM: "Have you ever had this type of abdominal pain before?" If so, ask: "When was the last time?" and "What happened that time?"      No   Had inguinal hernia repair 3 yrs ago. 8. CAUSE: "What do you think is causing the abdominal pain?"     Maybe an abd hernia.   He is  Tax adviser.   It's superior to where he had the hernia repair. 9. RELIEVING/AGGRAVATING FACTORS: "What makes it better or worse?" (e.g., movement, antacids, bowel movement)     No constipation 10. OTHER SYMPTOMS: "Has there been any vomiting, diarrhea, constipation, or urine problems?"       On CT scan had large bowel diverticula that is not inflamed.  Protocols used: ABDOMINAL PAIN - MALE-A-AH

## 2019-03-31 NOTE — Addendum Note (Signed)
Addended by: Marrion Coy on: 03/31/2019 04:33 PM   Modules accepted: Orders

## 2019-03-31 NOTE — Telephone Encounter (Signed)
Spoke with pt- I have ordered a stat CT of his abdomen and pelvis at [facility] on [address].  Sharyn Lull will call patient.

## 2019-04-01 ENCOUNTER — Other Ambulatory Visit: Payer: Self-pay | Admitting: Family Medicine

## 2019-04-01 DIAGNOSIS — N200 Calculus of kidney: Secondary | ICD-10-CM

## 2019-05-15 ENCOUNTER — Other Ambulatory Visit: Payer: Self-pay | Admitting: Family Medicine

## 2019-05-25 ENCOUNTER — Telehealth: Payer: Self-pay | Admitting: Family Medicine

## 2019-05-25 NOTE — Telephone Encounter (Signed)
I called patient in reference to message received via mychart to schedule flu shot and shingrix. I was unable to leave a voicemail due to voicemail being full. Patient can schedule nurse visit for flu/shingrix.

## 2019-05-29 NOTE — Telephone Encounter (Signed)
Patient is scheduled for nurse visit on 05/30/19 for Flu/Shingrix vaccination. Is it ok to administer Shingrix? Please advise. Thanks.

## 2019-05-29 NOTE — Telephone Encounter (Signed)
Yes okay to administer both.

## 2019-05-30 ENCOUNTER — Ambulatory Visit (INDEPENDENT_AMBULATORY_CARE_PROVIDER_SITE_OTHER): Payer: Medicare Other | Admitting: Behavioral Health

## 2019-05-30 ENCOUNTER — Encounter: Payer: Self-pay | Admitting: Family Medicine

## 2019-05-30 ENCOUNTER — Other Ambulatory Visit: Payer: Self-pay

## 2019-05-30 DIAGNOSIS — Z23 Encounter for immunization: Secondary | ICD-10-CM | POA: Diagnosis not present

## 2019-05-30 NOTE — Progress Notes (Signed)
Patient presents in office today for Influenza and Shingrix vaccinations. IM injections were given in both the right and left deltoids. Patient tolerated both injections well. No signs or symptoms of a reaction were noted prior to patient leaving the nurse visit.

## 2019-06-07 ENCOUNTER — Other Ambulatory Visit: Payer: Self-pay | Admitting: Family Medicine

## 2019-06-13 ENCOUNTER — Other Ambulatory Visit: Payer: Self-pay

## 2019-06-13 MED ORDER — METOPROLOL SUCCINATE ER 25 MG PO TB24: ORAL_TABLET | ORAL | 3 refills | Status: DC

## 2019-08-07 ENCOUNTER — Other Ambulatory Visit: Payer: Self-pay

## 2019-08-08 ENCOUNTER — Ambulatory Visit (INDEPENDENT_AMBULATORY_CARE_PROVIDER_SITE_OTHER): Payer: Medicare Other

## 2019-08-08 DIAGNOSIS — Z23 Encounter for immunization: Secondary | ICD-10-CM

## 2019-08-08 NOTE — Progress Notes (Signed)
Patient presents in office today for 2nd Shingrix vaccination. IM injection given in  left deltoid. Patient tolerated  injection well. No signs or symptoms of a reaction were noted prior to patient leaving the nurse visit.

## 2019-10-25 ENCOUNTER — Other Ambulatory Visit: Payer: Self-pay

## 2019-10-25 MED ORDER — MECLIZINE HCL 25 MG PO TABS: ORAL_TABLET | ORAL | 0 refills | Status: DC

## 2020-01-04 ENCOUNTER — Telehealth: Payer: Self-pay | Admitting: Family Medicine

## 2020-01-04 NOTE — Telephone Encounter (Signed)
Patient would like to know if he could have labs drawn before he come in for [facility] on 01/09/20 Please advise.

## 2020-01-04 NOTE — Telephone Encounter (Signed)
Patient has TOC/Physical appt on Tuesday 5/18 and wants to have labs done prior. He want to have them done at [facility].

## 2020-01-04 NOTE — Telephone Encounter (Signed)
I will order labs after the visit.

## 2020-01-05 NOTE — Telephone Encounter (Signed)
Patient called back and said to tell Dr. Ethelene Hal that he is Richard Ibarra and he insists on having his labs done prior to his appt so he can discuss the results with his doctor.

## 2020-01-08 NOTE — Telephone Encounter (Signed)
Appointment canceled by patient

## 2020-01-09 ENCOUNTER — Encounter: Payer: Medicare Other | Admitting: Family Medicine

## 2020-01-26 ENCOUNTER — Other Ambulatory Visit: Payer: Self-pay

## 2020-01-26 MED ORDER — ROSUVASTATIN CALCIUM 10 MG PO TABS: 10.0000 mg | ORAL_TABLET | Freq: Every day | ORAL | 0 refills | Status: DC

## 2020-02-01 ENCOUNTER — Other Ambulatory Visit: Payer: Self-pay

## 2020-02-01 ENCOUNTER — Encounter: Payer: Self-pay | Admitting: Family Medicine

## 2020-02-01 ENCOUNTER — Ambulatory Visit (INDEPENDENT_AMBULATORY_CARE_PROVIDER_SITE_OTHER): Payer: Medicare Other | Admitting: Family Medicine

## 2020-02-01 VITALS — BP 138/74 | HR 73 | Wt 162.0 lb

## 2020-02-01 DIAGNOSIS — Z85828 Personal history of other malignant neoplasm of skin: Secondary | ICD-10-CM

## 2020-02-01 DIAGNOSIS — E782 Mixed hyperlipidemia: Secondary | ICD-10-CM | POA: Diagnosis not present

## 2020-02-01 DIAGNOSIS — I10 Essential (primary) hypertension: Secondary | ICD-10-CM

## 2020-02-01 DIAGNOSIS — E02 Subclinical iodine-deficiency hypothyroidism: Secondary | ICD-10-CM | POA: Diagnosis not present

## 2020-02-01 DIAGNOSIS — Z125 Encounter for screening for malignant neoplasm of prostate: Secondary | ICD-10-CM

## 2020-02-01 DIAGNOSIS — N1831 Chronic kidney disease, stage 3a: Secondary | ICD-10-CM

## 2020-02-01 DIAGNOSIS — N400 Enlarged prostate without lower urinary tract symptoms: Secondary | ICD-10-CM

## 2020-02-01 MED ORDER — LEVOTHYROXINE SODIUM 112 MCG PO TABS: 112.0000 ug | ORAL_TABLET | Freq: Every day | ORAL | 3 refills | Status: DC

## 2020-02-01 MED ORDER — OLMESARTAN MEDOXOMIL 20 MG PO TABS: ORAL_TABLET | ORAL | 3 refills | Status: DC

## 2020-02-01 MED ORDER — NIACIN ER (ANTIHYPERLIPIDEMIC) 1000 MG PO TBCR: EXTENDED_RELEASE_TABLET | ORAL | 3 refills | Status: DC

## 2020-02-01 MED ORDER — ROSUVASTATIN CALCIUM 10 MG PO TABS: 10.0000 mg | ORAL_TABLET | Freq: Every day | ORAL | 0 refills | Status: DC

## 2020-02-01 MED ORDER — METOPROLOL SUCCINATE ER 25 MG PO TB24: ORAL_TABLET | ORAL | 3 refills | Status: DC

## 2020-02-01 NOTE — Assessment & Plan Note (Signed)
In setting of HTN. Will follow annually.

## 2020-02-01 NOTE — Assessment & Plan Note (Signed)
BP at goal. Cont metoprolol and olmesartan

## 2020-02-01 NOTE — Assessment & Plan Note (Signed)
Stable TSH for years. Recheck labs. Cont levothyroxine

## 2020-02-01 NOTE — Assessment & Plan Note (Signed)
Continue crestor and niacin. Panel per patient request.

## 2020-02-01 NOTE — Assessment & Plan Note (Signed)
Follows with dermatology 

## 2020-02-01 NOTE — Progress Notes (Signed)
Subjective:     Richard Ibarra is a 74 y.o. male presenting for Establish Care     HPI   #BPH - has been following with urology - would like annual psa  #BCC - sees dermatology regularly - has been a few years since last lesion  #HTN - good control - feeling well - running regularly - low salt diet    Review of Systems   Social History   Tobacco Use  Smoking Status Never Smoker  Smokeless Tobacco Never Used        Objective:    BP Readings from Last 3 Encounters:  02/01/20 138/74  03/07/19 119/66  01/04/19 132/68   Wt Readings from Last 3 Encounters:  02/01/20 162 lb (73.5 kg)  03/06/19 150 lb (68 kg)  01/04/19 152 lb 6.4 oz (69.1 kg)    BP 138/74   Pulse 73   Wt 162 lb (73.5 kg)   SpO2 98%   BMI 24.63 kg/m    Physical Exam Constitutional:      Appearance: Normal appearance. He is not ill-appearing or diaphoretic.  HENT:     Right Ear: External ear normal.     Left Ear: External ear normal.  Eyes:     General: No scleral icterus.    Extraocular Movements: Extraocular movements intact.     Conjunctiva/sclera: Conjunctivae normal.  Cardiovascular:     Rate and Rhythm: Normal rate and regular rhythm.     Heart sounds: No murmur heard.   Pulmonary:     Effort: Pulmonary effort is normal. No respiratory distress.     Breath sounds: Normal breath sounds. No wheezing.  Musculoskeletal:     Cervical back: Neck supple.  Skin:    General: Skin is warm and dry.  Neurological:     Mental Status: He is alert. Mental status is at baseline.  Psychiatric:        Mood and Affect: Mood normal.           Assessment & Plan:   Problem List Items Addressed This Visit      Cardiovascular and Mediastinum   Essential hypertension - Primary    BP at goal. Cont metoprolol and olmesartan      Relevant Medications   rosuvastatin (CRESTOR) 10 MG tablet   olmesartan (BENICAR) 20 MG tablet   niacin (NIASPAN) 1000 MG CR tablet   metoprolol  succinate (TOPROL-XL) 25 MG 24 hr tablet   Other Relevant Orders   Comprehensive metabolic panel     Endocrine   Hypothyroidism    Stable TSH for years. Recheck labs. Cont levothyroxine      Relevant Medications   metoprolol succinate (TOPROL-XL) 25 MG 24 hr tablet   levothyroxine (SYNTHROID) 112 MCG tablet   Other Relevant Orders   TSH     Musculoskeletal and Integument   History of basal cell carcinoma (BCC)    Follows with dermatology        Genitourinary   CKD (chronic kidney disease) stage 3, GFR 30-59 ml/min    In setting of HTN. Will follow annually.         Other   HLD (hyperlipidemia)    Continue crestor and niacin. Panel per patient request.       Relevant Medications   rosuvastatin (CRESTOR) 10 MG tablet   olmesartan (BENICAR) 20 MG tablet   niacin (NIASPAN) 1000 MG CR tablet   metoprolol succinate (TOPROL-XL) 25 MG 24 hr tablet   Other Relevant Orders  NMR, lipoprofile    Other Visit Diagnoses    Screening for prostate cancer       Relevant Orders   PSA   Benign prostatic hyperplasia without lower urinary tract symptoms       Relevant Orders   PSA       Return in about 1 year (around 01/31/2021).  Lesleigh Noe, MD

## 2020-02-20 LAB — NMR, LIPOPROFILE
Cholesterol, Total: 158 mg/dL (ref 100–199)
HDL Particle Number: 36.3 umol/L (ref >=30.5)
HDL-C: 56 mg/dL (ref >39)
LDL Particle Number: 1131 nmol/L — ABNORMAL HIGH (ref <1000)
LDL Size: 20.8 nm (ref >20.5)
LDL-C (NIH Calc): 83 mg/dL (ref 0–99)
LP-IR Score: 59 — ABNORMAL HIGH (ref <=45)
Small LDL Particle Number: 627 nmol/L — ABNORMAL HIGH (ref <=527)
Triglycerides: 103 mg/dL (ref 0–149)

## 2020-02-20 LAB — COMPREHENSIVE METABOLIC PANEL
ALT: 9 IU/L (ref 0–44)
AST: 21 IU/L (ref 0–40)
Albumin/Globulin Ratio: 1.6 (ref 1.2–2.2)
Albumin: 4.5 g/dL (ref 3.7–4.7)
Alkaline Phosphatase: 70 IU/L (ref 48–121)
BUN/Creatinine Ratio: 19 (ref 10–24)
BUN: 26 mg/dL (ref 8–27)
Bilirubin Total: 0.6 mg/dL (ref 0.0–1.2)
CO2: 19 mmol/L — ABNORMAL LOW (ref 20–29)
Calcium: 9.4 mg/dL (ref 8.6–10.2)
Chloride: 99 mmol/L (ref 96–106)
Creatinine, Ser: 1.34 mg/dL — ABNORMAL HIGH (ref 0.76–1.27)
GFR calc Af Amer: 60 mL/min/{1.73_m2} (ref >59)
GFR calc non Af Amer: 52 mL/min/{1.73_m2} — ABNORMAL LOW (ref >59)
Globulin, Total: 2.8 g/dL (ref 1.5–4.5)
Glucose: 103 mg/dL — ABNORMAL HIGH (ref 65–99)
Potassium: 4.8 mmol/L (ref 3.5–5.2)
Sodium: 133 mmol/L — ABNORMAL LOW (ref 134–144)
Total Protein: 7.3 g/dL (ref 6.0–8.5)

## 2020-02-20 LAB — PSA: Prostate Specific Ag, Serum: 1.4 ng/mL (ref 0.0–4.0)

## 2020-02-20 LAB — TSH: TSH: 1.15 u[IU]/mL (ref 0.450–4.500)

## 2020-07-27 ENCOUNTER — Other Ambulatory Visit: Payer: Self-pay | Admitting: Family Medicine

## 2020-07-27 DIAGNOSIS — E782 Mixed hyperlipidemia: Secondary | ICD-10-CM

## 2021-01-25 ENCOUNTER — Other Ambulatory Visit: Payer: Self-pay | Admitting: Family Medicine

## 2021-01-25 DIAGNOSIS — E782 Mixed hyperlipidemia: Secondary | ICD-10-CM

## 2021-02-21 ENCOUNTER — Encounter: Payer: Self-pay | Admitting: Family Medicine

## 2021-02-27 ENCOUNTER — Encounter: Payer: Self-pay | Admitting: Family Medicine

## 2021-02-27 ENCOUNTER — Other Ambulatory Visit: Payer: Self-pay

## 2021-02-27 ENCOUNTER — Ambulatory Visit (INDEPENDENT_AMBULATORY_CARE_PROVIDER_SITE_OTHER): Payer: Medicare Other | Admitting: Family Medicine

## 2021-02-27 VITALS — BP 160/82 | HR 82 | Temp 97.6°F | Ht 63.75 in | Wt 151.5 lb

## 2021-02-27 DIAGNOSIS — I10 Essential (primary) hypertension: Secondary | ICD-10-CM

## 2021-02-27 DIAGNOSIS — E782 Mixed hyperlipidemia: Secondary | ICD-10-CM

## 2021-02-27 DIAGNOSIS — E02 Subclinical iodine-deficiency hypothyroidism: Secondary | ICD-10-CM | POA: Diagnosis not present

## 2021-02-27 DIAGNOSIS — Z1211 Encounter for screening for malignant neoplasm of colon: Secondary | ICD-10-CM

## 2021-02-27 DIAGNOSIS — R351 Nocturia: Secondary | ICD-10-CM

## 2021-02-27 DIAGNOSIS — Z0000 Encounter for general adult medical examination without abnormal findings: Secondary | ICD-10-CM

## 2021-02-27 DIAGNOSIS — Z125 Encounter for screening for malignant neoplasm of prostate: Secondary | ICD-10-CM

## 2021-02-27 MED ORDER — ROSUVASTATIN CALCIUM 10 MG PO TABS
ORAL_TABLET | ORAL | 3 refills | Status: DC
Start: 2021-02-27 — End: 2022-02-10

## 2021-02-27 MED ORDER — NIACIN ER (ANTIHYPERLIPIDEMIC) 1000 MG PO TBCR
EXTENDED_RELEASE_TABLET | ORAL | 3 refills | Status: DC
Start: 2021-02-27 — End: 2022-02-16

## 2021-02-27 MED ORDER — LEVOTHYROXINE SODIUM 112 MCG PO TABS
112.0000 ug | ORAL_TABLET | Freq: Every day | ORAL | 3 refills | Status: DC
Start: 2021-02-27 — End: 2022-03-09

## 2021-02-27 NOTE — Patient Instructions (Signed)
You can go to [facility] to get your labs  We can send results when we get them  Continue to monitor your blood pressure and call if >150/90 regularly

## 2021-02-27 NOTE — Progress Notes (Signed)
Subjective:   Richard Ibarra is a 75 y.o. male who presents for Medicare Annual/Subsequent preventive examination.  #HTN - was getting hypotensive following exercise - stopped metoprolol - was taking benicar and he was still getting dizziness  - home monitoring occasionally up to 140/60-70 - gets whitecoat htn   Review of Systems    Review of Systems  Constitutional:  Negative for chills and fever.  HENT:  Negative for congestion and sore throat.   Eyes:  Negative for blurred vision and double vision.  Respiratory:  Negative for shortness of breath.   Cardiovascular:  Negative for chest pain.  Gastrointestinal:  Negative for heartburn, nausea and vomiting.  Genitourinary: Negative.   Musculoskeletal: Negative.  Negative for myalgias.  Skin:  Negative for rash.  Neurological:  Negative for dizziness and headaches.  Endo/Heme/Allergies:  Does not bruise/bleed easily.  Psychiatric/Behavioral:  Negative for depression. The patient is not nervous/anxious.    Cardiac Risk Factors include: advanced age (>11men, >93 women);dyslipidemia;male gender;hypertension     Objective:    Today's Vitals   02/27/21 0924  BP: (!) 160/82  Pulse: 82  Temp: 97.6 F (36.4 C)  TempSrc: Temporal  SpO2: 97%  Weight: 151 lb 8 oz (68.7 kg)  Height: 5' 3.75" (1.619 m)   Body mass index is 26.21 kg/m.  Advanced Directives 02/27/2021 03/06/2019 02/14/2015 11/02/2011  Does Patient Have a Medical Advance Directive? No No No Patient does not have advance directive  Would patient like information on creating a medical advance directive? Yes (MAU/Ambulatory/Procedural Areas - Information given) - No - patient declined information -  Pre-existing out of facility DNR order (yellow form or pink MOST form) - - - No    Current Medications (verified) Outpatient Encounter Medications as of 02/27/2021  Medication Sig   fexofenadine (ALLEGRA) 180 MG tablet Take 1 tablet (180 mg total) by mouth daily.    finasteride (PROSCAR) 5 MG tablet TAKE 1 TABLET(5 MG) BY MOUTH DAILY   meclizine (ANTIVERT) 25 MG tablet Take 1tid prn dizziness (Plz sched visit with new provider)   tamsulosin (FLOMAX) 0.4 MG CAPS capsule Take 1 capsule (0.4 mg total) by mouth daily.   [DISCONTINUED] levothyroxine (SYNTHROID) 112 MCG tablet Take 1 tablet (112 mcg total) by mouth daily.   [DISCONTINUED] niacin (NIASPAN) 1000 MG CR tablet TAKE 1 TABLET(1000 MG) BY MOUTH AT BEDTIME   [DISCONTINUED] rosuvastatin (CRESTOR) 10 MG tablet TAKE 1 TABLET BY MOUTH EVERYDAY AT BEDTIME   levothyroxine (SYNTHROID) 112 MCG tablet Take 1 tablet (112 mcg total) by mouth daily.   niacin (NIASPAN) 1000 MG CR tablet TAKE 1 TABLET(1000 MG) BY MOUTH AT BEDTIME   rosuvastatin (CRESTOR) 10 MG tablet TAKE 1 TABLET BY MOUTH EVERYDAY AT BEDTIME   [DISCONTINUED] metoprolol succinate (TOPROL-XL) 25 MG 24 hr tablet TAKE 1 TABLET(25 MG) BY MOUTH DAILY   [DISCONTINUED] olmesartan (BENICAR) 20 MG tablet TAKE 1 TABLET(20 MG) BY MOUTH AT BEDTIME   Facility-Administered Encounter Medications as of 02/27/2021  Medication   opium-belladonna (B&O SUPPRETTES) suppository 1 suppository    Allergies (verified) Banana and Food   History: Past Medical History:  Diagnosis Date   Allergy    BCC (basal cell carcinoma of skin)    Chronic kidney disease    Elevated Creatine   Gross hematuria 10-30-11   story consistent with exercise induced, eval by uro with 1.9cm stone L kidney   Hypertension    Hypothyroidism    Seasonal allergies 11-02-11   Tx. Allegra daily  Thyroid disease    Past Surgical History:  Procedure Laterality Date   CYSTOSCOPY  11/09/2011   Procedure: CYSTOSCOPY;  Surgeon: Ailene Rud, MD;  Location: [facility];  Service: Urology;  Laterality: N/A;   INGUINAL HERNIA REPAIR Right 02/14/2015   Procedure: RIGHT INGUINAL HERNIA REPAIR WITH MESH;  Surgeon: Erroll Luna, MD;  Location: [facility];  Service: General;  Laterality:  Right;   INSERTION OF MESH Right 02/14/2015   Procedure: INSERTION OF MESH;  Surgeon: Erroll Luna, MD;  Location: [facility];  Service: General;  Laterality: Right;   NO PAST SURGERIES     PROSTATE BIOPSY  01/29/2006   Dr. Gaynelle Arabian   rash testing  02/17/1989   multiple positives   Family History  Problem Relation Age of Onset   Alzheimer's disease Mother    Cerebral aneurysm Father    Social History   Socioeconomic History   Marital status: Married    Spouse name: Rose   Number of children: 2   Years of education: PhD   Highest education level: Not on file  Occupational History   Occupation: Therapist, art: [employer]  Tobacco Use   Smoking status: Never   Smokeless tobacco: Never  Vaping Use   Vaping Use: Never used  Substance and Sexual Activity   Alcohol use: Yes    Comment: 2 glasses of wine a week   Drug use: Never   Sexual activity: Yes    Birth control/protection: Post-menopausal  Other Topics Concern   Not on file  Social History Narrative   02/01/20   From: [place] - originally but has been here for years   Living: with wife Kalman Shan (1998)   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      Family: [place] and [place], and [place] and Lavella Lemons (step daughters) - 76 grandchildren - Levada Dy in [place] otherwise kids are nearby      Enjoys: house chores,       Exercise: runs 1.5 miles every other day, muscle strengthening the other days   Diet: tries to follow mediterranean diet       Safety   Seat belts: Yes    Guns: No   Safe in relationships: Yes    Social Determinants of Radio broadcast assistant Strain: Not on file  Food Insecurity: Not on file  Transportation Needs: Not on file  Physical Activity: Not on file  Stress: Not on file  Social Connections: Not on file    Tobacco Counseling Counseling given: Not Answered   Clinical Intake:  Pre-visit preparation completed: No  Pain : No/denies pain     BMI - recorded:  26.21 Nutritional Status: BMI 25 -29 Overweight Nutritional Risks: None Diabetes: No  How often do you need to have someone help you when you read instructions, pamphlets, or other written materials from your doctor or pharmacy?: 1 - Never What is the last grade level you completed in school?: PhD  Diabetic?no  Interpreter Needed?: No      Activities of Daily Living In your present state of health, do you have any difficulty performing the following activities: 02/27/2021  Hearing? N  Vision? N  Difficulty concentrating or making decisions? N  Walking or climbing stairs? N  Dressing or bathing? N  Doing errands, shopping? N  Preparing Food and eating ? N  Using the Toilet? N  In the past six months, have you accidently leaked urine? N  Do you have problems with loss of bowel control? N  Managing your Medications? N  Managing your Finances? N  Housekeeping or managing your Housekeeping? N  Some recent data might be hidden    Patient Care Team: Lesleigh Noe, MD as PCP - General (Family Medicine) Dasher, Rayvon Char, MD (Dermatology) Raynelle Bring, MD as Consulting Physician (Urology)  Indicate any recent Medical Services you may have received from other than [facility] providers in the past year (date may be approximate).     Assessment:   This is a routine wellness examination for Obrian.  Hearing/Vision screen Hearing Screening   250Hz  500Hz  1000Hz  2000Hz  4000Hz   Right ear 20 40 40 40 0  Left ear 20 20 20 20  40   Vision Screening   Right eye Left eye Both eyes  Without correction 20/25 20/25 20/20   With correction       Dietary issues and exercise activities discussed: Current Exercise Habits: Home exercise routine, Type of exercise: strength training/weights;treadmill, Time (Minutes): 50, Frequency (Times/Week): 6, Weekly Exercise (Minutes/Week): 300, Intensity: Moderate, Exercise limited by: cardiac condition(s)   Goals Addressed             This Visit's  Progress    Exercise       Maintain current exercise       Depression Screen PHQ 2/9 Scores 02/27/2021 02/27/2021 01/04/2019 12/30/2017 12/28/2016  PHQ - 2 Score 0 0 0 0 0    Fall Risk Fall Risk  02/27/2021 02/27/2021 01/04/2019 12/30/2017 12/28/2016  Falls in the past year? 0 0 0 No No  Number falls in past yr: 0 0 - - -  Injury with Fall? 0 - - - -  Risk for fall due to : No Fall Risks - - - -  Follow up Falls evaluation completed - Falls evaluation completed - -    FALL RISK PREVENTION PERTAINING TO THE HOME:  Any stairs in or around the home? Yes  If so, are there any without handrails? Yes  Home free of loose throw rugs in walkways, pet beds, electrical cords, etc? No  Adequate lighting in your home to reduce risk of falls? Yes   ASSISTIVE DEVICES UTILIZED TO PREVENT FALLS:  Life alert? No  Use of a cane, walker or w/c? No  Grab bars in the bathroom? No  Shower chair or bench in shower? Yes  Elevated toilet seat or a handicapped toilet? Yes   TIMED UP AND GO:  Was the test performed? No .    Gait steady and fast without use of assistive device  Cognitive Function:         Mini-Cog - 02/27/21 0949     Normal clock drawing test? yes    How many words correct? 3              Immunizations Immunization History  Administered Date(s) Administered   Fluad Quad(high Dose 65+) 05/30/2019, 05/15/2020   Influenza, High Dose Seasonal PF 06/18/2017   Influenza,inj,Quad PF,6+ Mos 09/04/2016, 06/07/2018   PFIZER(Purple Top)SARS-COV-2 Vaccination 09/27/2019, 10/18/2019, 05/15/2020, 11/21/2020   Pneumococcal Conjugate-13 12/24/2014   Pneumococcal Polysaccharide-23 11/04/2011   Td 09/05/2001, 11/04/2011   Zoster Recombinat (Shingrix) 05/30/2019, 08/08/2019   Zoster, Live 03/10/2010    TDAP status: Up to date  Flu Vaccine status: Up to date  Pneumococcal vaccine status: Up to date  Covid-19 vaccine status: Information provided on how to obtain vaccines.   Qualifies for  Shingles Vaccine? Yes   Zostavax completed Yes  Shingrix Completed?: Yes  Screening Tests Health Maintenance  Topic Date Due   Hepatitis C Screening  Never done   Fecal DNA (Cologuard)  Never done   COVID-19 Vaccine (5 - Booster for Pfizer series) 03/23/2021   INFLUENZA VACCINE  03/24/2021   TETANUS/TDAP  11/03/2021   PNA vac Low Risk Adult  Completed   Zoster Vaccines- Shingrix  Completed   HPV VACCINES  Aged Out    Health Maintenance  Health Maintenance Due  Topic Date Due   Hepatitis C Screening  Never done   Fecal DNA (Cologuard)  Never done    Colorectal cancer screening: Type of screening: FOBT/FIT. Completed 2017. Repeat every 1 years  Lung Cancer Screening: (Low Dose CT Chest recommended if Age 56-80 years, 30 pack-year currently smoking OR have quit w/in 15years.) does not qualify.   Lung Cancer Screening Referral: n/a  Additional Screening:  Hepatitis C Screening: does qualify; Completed   Vision Screening: Recommended annual ophthalmology exams for early detection of glaucoma and other disorders of the eye. Is the patient up to date with their annual eye exam?  Yes  Who is the provider or what is the name of the office in which the patient attends annual eye exams? [facility] If pt is not established with a provider, would they like to be referred to a provider to establish care? No .   Dental Screening: Recommended annual dental exams for proper oral hygiene  Community Resource Referral / Chronic Care Management: CRR required this visit?  No   CCM required this visit?  No      Plan:    Problem List Items Addressed This Visit       Cardiovascular and Mediastinum   Essential hypertension   Relevant Medications   niacin (NIASPAN) 1000 MG CR tablet   rosuvastatin (CRESTOR) 10 MG tablet   Other Relevant Orders   Comprehensive metabolic panel     Endocrine   Hypothyroidism   Relevant Medications   levothyroxine (SYNTHROID) 112 MCG tablet   Other  Relevant Orders   TSH     Other   HLD (hyperlipidemia)   Relevant Medications   niacin (NIASPAN) 1000 MG CR tablet   rosuvastatin (CRESTOR) 10 MG tablet   Other Relevant Orders   NMR, lipoprofile   Other Visit Diagnoses     Encounter for Medicare annual wellness exam    -  Primary   Screening for colon cancer       Relevant Orders   Fecal occult blood, imunochemical   Screening for prostate cancer       Relevant Orders   PSA   Nocturia        Relevant Orders   PSA        I have personally reviewed and noted the following in the patient's chart:   Medical and social history Use of alcohol, tobacco or illicit drugs  Current medications and supplements including opioid prescriptions. Patient is not currently taking opioid prescriptions. Functional ability and status Nutritional status Physical activity Advanced directives List of other physicians Hospitalizations, surgeries, and ER visits in previous 12 months Vitals Screenings to include cognitive, depression, and falls Referrals and appointments  In addition, I have reviewed and discussed with patient certain preventive protocols, quality metrics, and best practice recommendations. A written personalized care plan for preventive services as well as general preventive health recommendations were provided to patient.     Lesleigh Noe, MD   02/27/2021

## 2021-03-12 ENCOUNTER — Other Ambulatory Visit: Payer: Self-pay | Admitting: Family Medicine

## 2021-03-12 DIAGNOSIS — E02 Subclinical iodine-deficiency hypothyroidism: Secondary | ICD-10-CM

## 2021-03-13 LAB — COMPREHENSIVE METABOLIC PANEL
ALT: 8 IU/L (ref 0–44)
AST: 19 IU/L (ref 0–40)
Albumin/Globulin Ratio: 2.1 (ref 1.2–2.2)
Albumin: 4.5 g/dL (ref 3.7–4.7)
Alkaline Phosphatase: 69 IU/L (ref 44–121)
BUN/Creatinine Ratio: 14 (ref 10–24)
BUN: 17 mg/dL (ref 8–27)
Bilirubin Total: 0.5 mg/dL (ref 0.0–1.2)
CO2: 23 mmol/L (ref 20–29)
Calcium: 9.2 mg/dL (ref 8.6–10.2)
Chloride: 98 mmol/L (ref 96–106)
Creatinine, Ser: 1.18 mg/dL (ref 0.76–1.27)
Globulin, Total: 2.1 g/dL (ref 1.5–4.5)
Glucose: 105 mg/dL — ABNORMAL HIGH (ref 65–99)
Potassium: 4.4 mmol/L (ref 3.5–5.2)
Sodium: 134 mmol/L (ref 134–144)
Total Protein: 6.6 g/dL (ref 6.0–8.5)
eGFR: 64 mL/min/{1.73_m2} (ref >59)

## 2021-03-13 LAB — NMR, LIPOPROFILE
Cholesterol, Total: 150 mg/dL (ref 100–199)
HDL Particle Number: 34.9 umol/L (ref >=30.5)
HDL-C: 63 mg/dL (ref >39)
LDL Particle Number: 884 nmol/L (ref <1000)
LDL Size: 20.4 nm — ABNORMAL LOW (ref >20.5)
LDL-C (NIH Calc): 70 mg/dL (ref 0–99)
LP-IR Score: 36 (ref <=45)
Small LDL Particle Number: 380 nmol/L (ref <=527)
Triglycerides: 92 mg/dL (ref 0–149)

## 2021-03-13 LAB — PSA: Prostate Specific Ag, Serum: 1.3 ng/mL (ref 0.0–4.0)

## 2021-03-13 LAB — TSH: TSH: 1.35 u[IU]/mL (ref 0.450–4.500)

## 2021-04-04 ENCOUNTER — Other Ambulatory Visit: Payer: Self-pay | Admitting: Family Medicine

## 2021-04-04 DIAGNOSIS — I10 Essential (primary) hypertension: Secondary | ICD-10-CM

## 2022-02-10 ENCOUNTER — Other Ambulatory Visit: Payer: Self-pay | Admitting: Family Medicine

## 2022-02-10 DIAGNOSIS — E782 Mixed hyperlipidemia: Secondary | ICD-10-CM

## 2022-02-14 ENCOUNTER — Other Ambulatory Visit: Payer: Self-pay | Admitting: Family Medicine

## 2022-02-14 DIAGNOSIS — E782 Mixed hyperlipidemia: Secondary | ICD-10-CM

## 2022-03-03 ENCOUNTER — Encounter: Payer: Medicare Other | Admitting: Family Medicine

## 2022-03-07 ENCOUNTER — Other Ambulatory Visit: Payer: Self-pay | Admitting: Family Medicine

## 2022-03-07 DIAGNOSIS — E02 Subclinical iodine-deficiency hypothyroidism: Secondary | ICD-10-CM

## 2022-03-12 ENCOUNTER — Ambulatory Visit (INDEPENDENT_AMBULATORY_CARE_PROVIDER_SITE_OTHER): Payer: Medicare Other

## 2022-03-12 VITALS — Ht 66.0 in | Wt 150.0 lb

## 2022-03-12 DIAGNOSIS — Z0000 Encounter for general adult medical examination without abnormal findings: Secondary | ICD-10-CM | POA: Diagnosis not present

## 2022-03-12 NOTE — Progress Notes (Signed)
I connected with Soledad Gerlach today by telephone and verified that I am speaking with the correct person using two identifiers. Location patient: home Location provider: work Persons participating in the virtual visit: Cashus Halterman, Glenna Durand LPN.   I discussed the limitations, risks, security and privacy concerns of performing an evaluation and management service by telephone and the availability of in person appointments. I also discussed with the patient that there may be a patient responsible charge related to this service. The patient expressed understanding and verbally consented to this telephonic visit.    Interactive audio and video telecommunications were attempted between this provider and patient, however failed, due to patient having technical difficulties OR patient did not have access to video capability.  We continued and completed visit with audio only.     Vital signs may be patient reported or missing.  Subjective:   Richard Ibarra is a 76 y.o. male who presents for Medicare Annual/Subsequent preventive examination.  Review of Systems     Cardiac Risk Factors include: advanced age (>74mn, >>4women);dyslipidemia;hypertension;male gender     Objective:    Today's Vitals   03/12/22 1611  Weight: 150 lb (68 kg)  Height: '5\' 6"'$  (1.676 m)   Body mass index is 24.21 kg/m.     03/12/2022    4:17 PM 02/27/2021    9:45 AM 03/06/2019   11:27 PM 02/14/2015    6:32 AM 11/02/2011   10:38 AM  Advanced Directives  Does Patient Have a Medical Advance Directive? Yes No No No Patient does not have advance directive  Type of AScientist, forensicPower of AAltoonaLiving will      Copy of [place] Chart? No - copy requested      Would patient like information on creating a medical advance directive?  Yes (MAU/Ambulatory/Procedural Areas - Information given)  No - patient declined information   Pre-existing out of facility DNR order (yellow  form or pink MOST form)     No    Current Medications (verified) Outpatient Encounter Medications as of 03/12/2022  Medication Sig   finasteride (PROSCAR) 5 MG tablet TAKE 1 TABLET(5 MG) BY MOUTH DAILY   levothyroxine (SYNTHROID) 112 MCG tablet TAKE 1 TABLET BY MOUTH EVERY DAY   meclizine (ANTIVERT) 25 MG tablet Take 1tid prn dizziness (Plz sched visit with new provider)   niacin (NIASPAN) 1000 MG CR tablet TAKE 1 TABLET(1000 MG) BY MOUTH AT BEDTIME   rosuvastatin (CRESTOR) 10 MG tablet TAKE 1 TABLET BY MOUTH EVERYDAY AT BEDTIME   tamsulosin (FLOMAX) 0.4 MG CAPS capsule Take 1 capsule (0.4 mg total) by mouth daily.   fexofenadine (ALLEGRA) 180 MG tablet Take 1 tablet (180 mg total) by mouth daily. (Patient not taking: Reported on 03/12/2022)   Facility-Administered Encounter Medications as of 03/12/2022  Medication   opium-belladonna (B&O SUPPRETTES) suppository 1 suppository    Allergies (verified) Banana and Food   History: Past Medical History:  Diagnosis Date   Allergy    BCC (basal cell carcinoma of skin)    Chronic kidney disease    Elevated Creatine   Gross hematuria 10-30-11   story consistent with exercise induced, eval by uro with 1.9cm stone L kidney   Hypertension    Hypothyroidism    Seasonal allergies 11-02-11   Tx. Allegra daily   Thyroid disease    Past Surgical History:  Procedure Laterality Date   CYSTOSCOPY  11/09/2011   Procedure: CYSTOSCOPY;  Surgeon: SAilene Rud  MD;  Location: [facility];  Service: Urology;  Laterality: N/A;   INGUINAL HERNIA REPAIR Right 02/14/2015   Procedure: RIGHT INGUINAL HERNIA REPAIR WITH MESH;  Surgeon: Erroll Luna, MD;  Location: [facility];  Service: General;  Laterality: Right;   INSERTION OF MESH Right 02/14/2015   Procedure: INSERTION OF MESH;  Surgeon: Erroll Luna, MD;  Location: [facility];  Service: General;  Laterality: Right;   NO PAST SURGERIES     PROSTATE BIOPSY  01/29/2006    Dr. Gaynelle Arabian   rash testing  02/17/1989   multiple positives   Family History  Problem Relation Age of Onset   Alzheimer's disease Mother    Cerebral aneurysm Father    Social History   Socioeconomic History   Marital status: Married    Spouse name: Rose   Number of children: 2   Years of education: PhD   Highest education level: Not on file  Occupational History   Occupation: Therapist, art: [employer]  Tobacco Use   Smoking status: Never   Smokeless tobacco: Never  Vaping Use   Vaping Use: Never used  Substance and Sexual Activity   Alcohol use: Yes    Comment: 2 glasses of wine a week   Drug use: Never   Sexual activity: Yes    Birth control/protection: Post-menopausal  Other Topics Concern   Not on file  Social History Narrative   02/01/20   From: [place] - originally but has been here for years   Living: with wife Richard Ibarra (1998)   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      Family: Richard Ibarra and Richard Ibarra, and Richard Ibarra and Richard Ibarra (step daughters) - 25 grandchildren - Levada Dy in [place] otherwise kids are nearby      Enjoys: house chores,       Exercise: runs 1.5 miles every other day, muscle strengthening the other days   Diet: tries to follow mediterranean diet       Safety   Seat belts: Yes    Guns: No   Safe in relationships: Yes    Social Determinants of Radio broadcast assistant Strain: Low Risk  (03/12/2022)   Overall Financial Resource Strain (CARDIA)    Difficulty of Paying Living Expenses: Not hard at all  Food Insecurity: No Food Insecurity (03/12/2022)   Hunger Vital Sign    Worried About Running Out of Food in the Last Year: Never true    [place] in the Last Year: Never true  Transportation Needs: No Transportation Needs (03/12/2022)   PRAPARE - Hydrologist (Medical): No    Lack of Transportation (Non-Medical): No  Physical Activity: Sufficiently Active (03/12/2022)   Exercise Vital Sign    Days of  Exercise per Week: 6 days    Minutes of Exercise per Session: 90 min  Stress: No Stress Concern Present (03/12/2022)   [place]    Feeling of Stress : Not at all  Social Connections: Not on file    Tobacco Counseling Counseling given: Not Answered   Clinical Intake:  Pre-visit preparation completed: Yes  Pain : No/denies pain     Nutritional Status: BMI 25 -29 Overweight Nutritional Risks: None Diabetes: No  How often do you need to have someone help you when you read instructions, pamphlets, or other written materials from your doctor or pharmacy?: 1 - Never What is  the last grade level you completed in school?: PhD  Diabetic? no  Interpreter Needed?: No  Information entered by :: NAllen LPN   Activities of Daily Living    03/12/2022    4:18 PM  In your present state of health, do you have any difficulty performing the following activities:  Hearing? 0  Vision? 0  Difficulty concentrating or making decisions? 0  Walking or climbing stairs? 0  Dressing or bathing? 0  Doing errands, shopping? 0  Preparing Food and eating ? N  Using the Toilet? N  In the past six months, have you accidently leaked urine? N  Do you have problems with loss of bowel control? N  Managing your Medications? N  Managing your Finances? N  Housekeeping or managing your Housekeeping? N    Patient Care Team: Lesleigh Noe, MD as PCP - General (Family Medicine) Dasher, Rayvon Char, MD (Dermatology) Raynelle Bring, MD as Consulting Physician (Urology)  Indicate any recent Medical Services you may have received from other than [facility] providers in the past year (date may be approximate).     Assessment:   This is a routine wellness examination for Richard Ibarra.  Hearing/Vision screen Vision Screening - Comments:: Regular eye exams, [facility]  Dietary issues and exercise activities discussed: Current Exercise Habits:  Home exercise routine, Type of exercise: strength training/weights;Other - see comments (running), Time (Minutes): > 60, Frequency (Times/Week): 6, Weekly Exercise (Minutes/Week): 0   Goals Addressed             This Visit's Progress    Patient Stated       03/12/2022, maintain exercise and not become hypertensive       Depression Screen    03/12/2022    4:18 PM 02/27/2021   10:29 AM 02/27/2021    9:47 AM 01/04/2019   11:24 AM 12/30/2017    8:19 AM 12/28/2016    8:39 AM  PHQ 2/9 Scores  PHQ - 2 Score 0 0 0 0 0 0    Fall Risk    03/12/2022    4:17 PM 02/27/2021    9:47 AM 02/27/2021    9:21 AM 01/04/2019   11:25 AM 12/30/2017    8:19 AM  [place] in the past year? 0 0 0 0 No  Number falls in past yr: 0 0 0    Injury with Fall? 0 0     Risk for fall due to : Medication side effect No Fall Risks     Follow up Falls evaluation completed;Education provided;Falls prevention discussed Falls evaluation completed  Falls evaluation completed     FALL RISK PREVENTION PERTAINING TO THE HOME:  Any stairs in or around the home? Yes  If so, are there any without handrails? No  Home free of loose throw rugs in walkways, pet beds, electrical cords, etc? Yes  Adequate lighting in your home to reduce risk of falls? Yes   ASSISTIVE DEVICES UTILIZED TO PREVENT FALLS:  Life alert? No  Use of a cane, walker or w/c? No  Grab bars in the bathroom? No  Shower chair or bench in shower? Yes  Elevated toilet seat or a handicapped toilet? Yes   TIMED UP AND GO:  Was the test performed? No .      Cognitive Function:        03/12/2022    4:19 PM  6CIT Screen  What Year? 0 points  What month? 0 points  What time? 0  points  Count back from 20 0 points  Months in reverse 2 points  Repeat phrase 0 points  Total Score 2 points    Immunizations Immunization History  Administered Date(s) Administered   Fluad Quad(high Dose 65+) 05/30/2019, 05/15/2020   Influenza, High Dose Seasonal  PF 06/18/2017   Influenza,inj,Quad PF,6+ Mos 09/04/2016, 06/07/2018   PFIZER(Purple Top)SARS-COV-2 Vaccination 09/27/2019, 10/18/2019, 05/15/2020, 11/21/2020   Pneumococcal Conjugate-13 12/24/2014   Pneumococcal Polysaccharide-23 11/04/2011   Td 09/05/2001, 11/04/2011   Zoster Recombinat (Shingrix) 05/30/2019, 08/08/2019   Zoster, Live 03/10/2010    TDAP status: Due, Education has been provided regarding the importance of this vaccine. Advised may receive this vaccine at local pharmacy or Health Dept. Aware to provide a copy of the vaccination record if obtained from local pharmacy or Health Dept. Verbalized acceptance and understanding.  Flu Vaccine status: Up to date  Pneumococcal vaccine status: Up to date  Covid-19 vaccine status: Completed vaccines  Qualifies for Shingles Vaccine? Yes   Zostavax completed Yes   Shingrix Completed?: Yes  Screening Tests Health Maintenance  Topic Date Due   Hepatitis C Screening  Never done   COVID-19 Vaccine (5 - Booster for Pfizer series) 01/16/2021   TETANUS/TDAP  11/03/2021   INFLUENZA VACCINE  03/24/2022   Pneumonia Vaccine 3+ Years old  Completed   Zoster Vaccines- Shingrix  Completed   HPV VACCINES  Aged Out    Health Maintenance  Health Maintenance Due  Topic Date Due   Hepatitis C Screening  Never done   COVID-19 Vaccine (5 - Booster for [place] series) 01/16/2021   TETANUS/TDAP  11/03/2021    Colorectal cancer screening: No longer required.   Lung Cancer Screening: (Low Dose CT Chest recommended if Age 37-80 years, 30 pack-year currently smoking OR have quit w/in 15years.) does not qualify.   Lung Cancer Screening Referral: no  Additional Screening:  Hepatitis C Screening: does qualify;   Vision Screening: Recommended annual ophthalmology exams for early detection of glaucoma and other disorders of the eye. Is the patient up to date with their annual eye exam?  Yes  Who is the provider or what is the name of the  office in which the patient attends annual eye exams? [facility] If pt is not established with a provider, would they like to be referred to a provider to establish care? No .   Dental Screening: Recommended annual dental exams for proper oral hygiene  Community Resource Referral / Chronic Care Management: CRR required this visit?  No   CCM required this visit?  No      Plan:     I have personally reviewed and noted the following in the patient's chart:   Medical and social history Use of alcohol, tobacco or illicit drugs  Current medications and supplements including opioid prescriptions. Patient is not currently taking opioid prescriptions. Functional ability and status Nutritional status Physical activity Advanced directives List of other physicians Hospitalizations, surgeries, and ER visits in previous 12 months Vitals Screenings to include cognitive, depression, and falls Referrals and appointments  In addition, I have reviewed and discussed with patient certain preventive protocols, quality metrics, and best practice recommendations. A written personalized care plan for preventive services as well as general preventive health recommendations were provided to patient.     Kellie Simmering, LPN   [id number]   Nurse Notes: none  Due to this being a virtual visit, the after visit summary with patients personalized plan was offered to patient via  mail or my-chart. Patient would like to access on my-chart

## 2022-03-12 NOTE — Patient Instructions (Signed)
Richard Ibarra , Thank you for taking time to come for your Medicare Wellness Visit. I appreciate your ongoing commitment to your health goals. Please review the following plan we discussed and let me know if I can assist you in the future.   Screening recommendations/referrals: Colonoscopy: not required Recommended yearly ophthalmology/optometry visit for glaucoma screening and checkup Recommended yearly dental visit for hygiene and checkup  Vaccinations: Influenza vaccine: due 03/24/2022 Pneumococcal vaccine: completed 12/24/2014 Tdap vaccine: due Shingles vaccine: completed   Covid-19:  11/21/2020, 05/15/2020, 10/18/2019, 09/27/2019  Advanced directives: Please bring a copy of your POA (Power of Attorney) and/or Living Will to your next appointment.   Conditions/risks identified: none  Next appointment: Follow up in one year for your annual wellness visit.   Preventive Care 32 Years and Older, Male Preventive care refers to lifestyle choices and visits with your health care provider that can promote health and wellness. What does preventive care include? A yearly physical exam. This is also called an annual well check. Dental exams once or twice a year. Routine eye exams. Ask your health care provider how often you should have your eyes checked. Personal lifestyle choices, including: Daily care of your teeth and gums. Regular physical activity. Eating a healthy diet. Avoiding tobacco and drug use. Limiting alcohol use. Practicing safe sex. Taking low doses of aspirin every day. Taking vitamin and mineral supplements as recommended by your health care provider. What happens during an annual well check? The services and screenings done by your health care provider during your annual well check will depend on your age, overall health, lifestyle risk factors, and family history of disease. Counseling  Your health care provider may ask you questions about your: Alcohol use. Tobacco  use. Drug use. Emotional well-being. Home and relationship well-being. Sexual activity. Eating habits. History of falls. Memory and ability to understand (cognition). Work and work Statistician. Screening  You may have the following tests or measurements: Height, weight, and BMI. Blood pressure. Lipid and cholesterol levels. These may be checked every 5 years, or more frequently if you are over 55 years old. Skin check. Lung cancer screening. You may have this screening every year starting at age 42 if you have a 30-pack-year history of smoking and currently smoke or have quit within the past 15 years. Fecal occult blood test (FOBT) of the stool. You may have this test every year starting at age 75. Flexible sigmoidoscopy or colonoscopy. You may have a sigmoidoscopy every 5 years or a colonoscopy every 10 years starting at age 40. Prostate cancer screening. Recommendations will vary depending on your family history and other risks. Hepatitis C blood test. Hepatitis B blood test. Sexually transmitted disease (STD) testing. Diabetes screening. This is done by checking your blood sugar (glucose) after you have not eaten for a while (fasting). You may have this done every 1-3 years. Abdominal aortic aneurysm (AAA) screening. You may need this if you are a current or former smoker. Osteoporosis. You may be screened starting at age 15 if you are at high risk. Talk with your health care provider about your test results, treatment options, and if necessary, the need for more tests. Vaccines  Your health care provider may recommend certain vaccines, such as: Influenza vaccine. This is recommended every year. Tetanus, diphtheria, and acellular pertussis (Tdap, Td) vaccine. You may need a Td booster every 10 years. Zoster vaccine. You may need this after age 72. Pneumococcal 13-valent conjugate (PCV13) vaccine. One dose is recommended after age  65. Pneumococcal polysaccharide (PPSV23) vaccine.  One dose is recommended after age 46. Talk to your health care provider about which screenings and vaccines you need and how often you need them. This information is not intended to replace advice given to you by your health care provider. Make sure you discuss any questions you have with your health care provider. Document Released: 09/06/2015 Document Revised: 04/29/2016 Document Reviewed: 06/11/2015 Elsevier Interactive Patient Education  2017 [place] Prevention in the Home Falls can cause injuries. They can happen to people of all ages. There are many things you can do to make your home safe and to help prevent falls. What can I do on the outside of my home? Regularly fix the edges of walkways and driveways and fix any cracks. Remove anything that might make you trip as you walk through a door, such as a raised step or threshold. Trim any bushes or trees on the path to your home. Use bright outdoor lighting. Clear any walking paths of anything that might make someone trip, such as rocks or tools. Regularly check to see if handrails are loose or broken. Make sure that both sides of any steps have handrails. Any raised decks and porches should have guardrails on the edges. Have any leaves, snow, or ice cleared regularly. Use sand or salt on walking paths during winter. Clean up any spills in your garage right away. This includes oil or grease spills. What can I do in the bathroom? Use night lights. Install grab bars by the toilet and in the tub and shower. Do not use towel bars as grab bars. Use non-skid mats or decals in the tub or shower. If you need to sit down in the shower, use a plastic, non-slip stool. Keep the floor dry. Clean up any water that spills on the floor as soon as it happens. Remove soap buildup in the tub or shower regularly. Attach bath mats securely with double-sided non-slip rug tape. Do not have throw rugs and other things on the floor that can make  you trip. What can I do in the bedroom? Use night lights. Make sure that you have a light by your bed that is easy to reach. Do not use any sheets or blankets that are too big for your bed. They should not hang down onto the floor. Have a firm chair that has side arms. You can use this for support while you get dressed. Do not have throw rugs and other things on the floor that can make you trip. What can I do in the kitchen? Clean up any spills right away. Avoid walking on wet floors. Keep items that you use a lot in easy-to-reach places. If you need to reach something above you, use a strong step stool that has a grab bar. Keep electrical cords out of the way. Do not use floor polish or wax that makes floors slippery. If you must use wax, use non-skid floor wax. Do not have throw rugs and other things on the floor that can make you trip. What can I do with my stairs? Do not leave any items on the stairs. Make sure that there are handrails on both sides of the stairs and use them. Fix handrails that are broken or loose. Make sure that handrails are as long as the stairways. Check any carpeting to make sure that it is firmly attached to the stairs. Fix any carpet that is loose or worn. Avoid having throw rugs at  the top or bottom of the stairs. If you do have throw rugs, attach them to the floor with carpet tape. Make sure that you have a light switch at the top of the stairs and the bottom of the stairs. If you do not have them, ask someone to add them for you. What else can I do to help prevent falls? Wear shoes that: Do not have high heels. Have rubber bottoms. Are comfortable and fit you well. Are closed at the toe. Do not wear sandals. If you use a stepladder: Make sure that it is fully opened. Do not climb a closed stepladder. Make sure that both sides of the stepladder are locked into place. Ask someone to hold it for you, if possible. Clearly mark and make sure that you can  see: Any grab bars or handrails. First and last steps. Where the edge of each step is. Use tools that help you move around (mobility aids) if they are needed. These include: Canes. Walkers. Scooters. Crutches. Turn on the lights when you go into a dark area. Replace any light bulbs as soon as they burn out. Set up your furniture so you have a clear path. Avoid moving your furniture around. If any of your floors are uneven, fix them. If there are any pets around you, be aware of where they are. Review your medicines with your doctor. Some medicines can make you feel dizzy. This can increase your chance of falling. Ask your doctor what other things that you can do to help prevent falls. This information is not intended to replace advice given to you by your health care provider. Make sure you discuss any questions you have with your health care provider. Document Released: 06/06/2009 Document Revised: 01/16/2016 Document Reviewed: 09/14/2014 Elsevier Interactive Patient Education  2017 [employer].

## 2022-03-24 ENCOUNTER — Ambulatory Visit (INDEPENDENT_AMBULATORY_CARE_PROVIDER_SITE_OTHER): Payer: Medicare Other | Admitting: Family Medicine

## 2022-03-24 VITALS — BP 130/62 | HR 76 | Temp 97.3°F | Ht 63.75 in | Wt 150.0 lb

## 2022-03-24 DIAGNOSIS — I10 Essential (primary) hypertension: Secondary | ICD-10-CM

## 2022-03-24 DIAGNOSIS — N1831 Chronic kidney disease, stage 3a: Secondary | ICD-10-CM

## 2022-03-24 DIAGNOSIS — H8110 Benign paroxysmal vertigo, unspecified ear: Secondary | ICD-10-CM

## 2022-03-24 DIAGNOSIS — Z1211 Encounter for screening for malignant neoplasm of colon: Secondary | ICD-10-CM

## 2022-03-24 DIAGNOSIS — Z85828 Personal history of other malignant neoplasm of skin: Secondary | ICD-10-CM

## 2022-03-24 DIAGNOSIS — N400 Enlarged prostate without lower urinary tract symptoms: Secondary | ICD-10-CM

## 2022-03-24 DIAGNOSIS — E782 Mixed hyperlipidemia: Secondary | ICD-10-CM | POA: Diagnosis not present

## 2022-03-24 DIAGNOSIS — E02 Subclinical iodine-deficiency hypothyroidism: Secondary | ICD-10-CM

## 2022-03-24 NOTE — Assessment & Plan Note (Signed)
Controlled. Not currently needing medication but taking finasteride and tamsulosin.

## 2022-03-24 NOTE — Assessment & Plan Note (Signed)
Lab Results  Component Value Date   LDLCALC 67 12/15/2018  Controlled. Repeat today. Cont niacin 1000 mg and crestor 10 mg

## 2022-03-24 NOTE — Progress Notes (Signed)
Subjective:     Richard Ibarra is a 76 y.o. male presenting for Medicare Wellness (Part 2 )     HPI   Doing well Taking medications Seeing urology annually for BPH - symptoms controlled Seeing dermatology - recently had another [facility]  Exercising regularly No fatigue   Review of Systems   Social History   Tobacco Use  Smoking Status Never  Smokeless Tobacco Never        Objective:    BP Readings from Last 3 Encounters:  03/24/22 130/62  02/27/21 (!) 160/82  02/01/20 138/74   Wt Readings from Last 3 Encounters:  03/24/22 150 lb (68 kg)  03/12/22 150 lb (68 kg)  02/27/21 151 lb 8 oz (68.7 kg)    BP 130/62   Pulse 76   Temp (!) 97.3 F (36.3 C) (Temporal)   Ht 5' 3.75" (1.619 m)   Wt 150 lb (68 kg)   SpO2 97%   BMI 25.95 kg/m    Physical Exam Constitutional:      Appearance: Normal appearance. He is not ill-appearing or diaphoretic.  HENT:     Head: Normocephalic and atraumatic.     Right Ear: Tympanic membrane and external ear normal.     Left Ear: Tympanic membrane and external ear normal.     Nose: Nose normal.  Eyes:     General: No scleral icterus.    Extraocular Movements: Extraocular movements intact.     Conjunctiva/sclera: Conjunctivae normal.  Cardiovascular:     Rate and Rhythm: Normal rate and regular rhythm.  Pulmonary:     Effort: Pulmonary effort is normal. No respiratory distress.     Breath sounds: Normal breath sounds. No wheezing.  Musculoskeletal:     Cervical back: Neck supple.  Skin:    General: Skin is warm and dry.  Neurological:     Mental Status: He is alert. Mental status is at baseline.  Psychiatric:        Mood and Affect: Mood normal.           Assessment & Plan:   Problem List Items Addressed This Visit       Cardiovascular and Mediastinum   Essential hypertension - Primary    Controlled. Not currently needing medication but taking finasteride and tamsulosin.       Relevant Orders    Comprehensive metabolic panel     Endocrine   Hypothyroidism    Controlled. Cont levothyroxine 112 mcg. Recheck today      Relevant Orders   TSH     Nervous and Auditory   BPPV (benign paroxysmal positional vertigo)    Rare. Resolves with prn meclizine.         Musculoskeletal and Integument   History of basal cell carcinoma (BCC)    Follows with Dr. Evorn Gong, has upcoming appointment        Genitourinary   CKD (chronic kidney disease) stage 3, GFR 30-59 ml/min (HCC)   BPH (benign prostatic hyperplasia)    Follows with urology. Controlled on finasteride 5 mg and tamsulosin 0.'4mg'$ .       Relevant Orders   PSA     Other   HLD (hyperlipidemia)    Lab Results  Component Value Date   LDLCALC 67 12/15/2018  Controlled. Repeat today. Cont niacin 1000 mg and crestor 10 mg       Relevant Orders   NMR, lipoprofile   Other Visit Diagnoses     Colon cancer screening  Relevant Orders   Fecal occult blood, imunochemical        Return in about 1 year (around 03/25/2023) for annual - medicare and f/u .  Lesleigh Noe, MD   2

## 2022-03-24 NOTE — Patient Instructions (Signed)
Get your labs and stool testing done at [facility].

## 2022-03-24 NOTE — Assessment & Plan Note (Signed)
Follows with urology. Controlled on finasteride 5 mg and tamsulosin 0.'4mg'$ .

## 2022-03-24 NOTE — Assessment & Plan Note (Signed)
Controlled. Cont levothyroxine 112 mcg. Recheck today

## 2022-03-24 NOTE — Assessment & Plan Note (Signed)
Rare. Resolves with prn meclizine.

## 2022-03-24 NOTE — Assessment & Plan Note (Signed)
Follows with Dr. Evorn Gong, has upcoming appointment

## 2022-03-27 ENCOUNTER — Other Ambulatory Visit: Payer: Self-pay | Admitting: Family Medicine

## 2022-03-27 ENCOUNTER — Encounter: Payer: Self-pay | Admitting: Family Medicine

## 2022-03-27 DIAGNOSIS — E02 Subclinical iodine-deficiency hypothyroidism: Secondary | ICD-10-CM

## 2022-03-27 MED ORDER — LEVOTHYROXINE SODIUM 100 MCG PO TABS: 100.0000 ug | ORAL_TABLET | Freq: Every day | ORAL | 0 refills | Status: DC

## 2022-03-30 LAB — COMPREHENSIVE METABOLIC PANEL
ALT: 11 IU/L (ref 0–44)
AST: 18 IU/L (ref 0–40)
Albumin/Globulin Ratio: 1.7 (ref 1.2–2.2)
Albumin: 4.3 g/dL (ref 3.8–4.8)
Alkaline Phosphatase: 71 IU/L (ref 44–121)
BUN/Creatinine Ratio: 16 (ref 10–24)
BUN: 19 mg/dL (ref 8–27)
Bilirubin Total: 0.4 mg/dL (ref 0.0–1.2)
CO2: 20 mmol/L (ref 20–29)
Calcium: 9.4 mg/dL (ref 8.6–10.2)
Chloride: 100 mmol/L (ref 96–106)
Creatinine, Ser: 1.19 mg/dL (ref 0.76–1.27)
Globulin, Total: 2.6 g/dL (ref 1.5–4.5)
Glucose: 110 mg/dL — ABNORMAL HIGH (ref 70–99)
Potassium: 4.5 mmol/L (ref 3.5–5.2)
Sodium: 134 mmol/L (ref 134–144)
Total Protein: 6.9 g/dL (ref 6.0–8.5)
eGFR: 63 mL/min/{1.73_m2} (ref >59)

## 2022-03-30 LAB — NMR, LIPOPROFILE
Cholesterol, Total: 136 mg/dL (ref 100–199)
HDL Particle Number: 34.8 umol/L (ref >=30.5)
HDL-C: 59 mg/dL (ref >39)
LDL Particle Number: 1073 nmol/L — ABNORMAL HIGH (ref <1000)
LDL Size: 20.2 nm — ABNORMAL LOW (ref >20.5)
LDL-C (NIH Calc): 62 mg/dL (ref 0–99)
LP-IR Score: 37 (ref <=45)
Small LDL Particle Number: 708 nmol/L — ABNORMAL HIGH (ref <=527)
Triglycerides: 73 mg/dL (ref 0–149)

## 2022-03-30 LAB — PSA: Prostate Specific Ag, Serum: 1.7 ng/mL (ref 0.0–4.0)

## 2022-03-30 LAB — TSH: TSH: 0.151 u[IU]/mL — ABNORMAL LOW (ref 0.450–4.500)

## 2022-04-07 ENCOUNTER — Other Ambulatory Visit: Payer: Medicare Other

## 2022-04-07 ENCOUNTER — Other Ambulatory Visit: Payer: Self-pay | Admitting: Family Medicine

## 2022-04-07 NOTE — Addendum Note (Signed)
Addended by: Ellamae Sia on: 04/07/2022 04:16 PM   Modules accepted: Orders

## 2022-05-19 ENCOUNTER — Other Ambulatory Visit: Payer: Self-pay | Admitting: Family Medicine

## 2022-05-19 DIAGNOSIS — E782 Mixed hyperlipidemia: Secondary | ICD-10-CM

## 2022-06-16 ENCOUNTER — Telehealth: Payer: Self-pay | Admitting: Family Medicine

## 2022-06-16 DIAGNOSIS — E039 Hypothyroidism, unspecified: Secondary | ICD-10-CM

## 2022-06-16 NOTE — Telephone Encounter (Signed)
Pt is requesting TSH  labs wants it done at [place] . Please advise # [phone]   Encourage patient to contact the pharmacy for refills or they can request refills through [facility]:  Please schedule appointment if longer than 1 year  NEXT APPOINTMENT DATE:  MEDICATION:levothyroxine (SYNTHROID) 100 MCG tablet  Is the patient out of medication?   PHARMACY:[id number] [place]  Let patient know to contact pharmacy at the end of the day to make sure medication is ready.  Please notify patient to allow 48-72 hours to process  CLINICAL FILLS OUT ALL BELOW:   LAST REFILL:  QTY:  REFILL DATE:    OTHER COMMENTS:    Okay for refill?  Please advise

## 2022-06-16 NOTE — Telephone Encounter (Signed)
Spoke to pt. He wants to go to [facility] to have them done. Changed his orders to [facility] in the next 2 weeks.

## 2022-06-22 LAB — TSH: TSH: 0.578 u[IU]/mL (ref 0.450–4.500)

## 2022-06-22 LAB — T4, FREE: Free T4: 1.74 ng/dL (ref 0.82–1.77)

## 2022-07-01 ENCOUNTER — Telehealth: Payer: Self-pay | Admitting: Family Medicine

## 2022-07-01 NOTE — Telephone Encounter (Signed)
Caller Name: claxton  Call back phone #: [phone]  MEDICATION(S):  levothyroxine (SYNTHROID) 100 MCG tablet   Days of Med Remaining: 3  Has the patient contacted their pharmacy (YES/NO)? NO What did pharmacy advise?   Preferred Pharmacy:  [id number]   ~~~Please advise patient/caregiver to allow 2-3 business days to process RX refills.   Pt stated Dr. Silvio Pate was waiting on lab results before refilling meds & pt never heard anything back.

## 2022-07-03 ENCOUNTER — Other Ambulatory Visit: Payer: Self-pay

## 2022-07-03 DIAGNOSIS — E02 Subclinical iodine-deficiency hypothyroidism: Secondary | ICD-10-CM

## 2022-07-03 MED ORDER — LEVOTHYROXINE SODIUM 100 MCG PO TABS: 100.0000 ug | ORAL_TABLET | Freq: Every day | ORAL | 0 refills | Status: DC

## 2022-07-06 NOTE — Telephone Encounter (Signed)
Refill was sent in. No further action needed at this time.

## 2022-07-14 ENCOUNTER — Other Ambulatory Visit: Payer: Self-pay

## 2022-07-14 DIAGNOSIS — E782 Mixed hyperlipidemia: Secondary | ICD-10-CM

## 2022-07-14 MED ORDER — ROSUVASTATIN CALCIUM 10 MG PO TABS: ORAL_TABLET | ORAL | 0 refills | Status: DC

## 2022-07-26 ENCOUNTER — Other Ambulatory Visit: Payer: Self-pay | Admitting: Internal Medicine

## 2022-07-26 DIAGNOSIS — E02 Subclinical iodine-deficiency hypothyroidism: Secondary | ICD-10-CM

## 2022-07-27 ENCOUNTER — Ambulatory Visit (INDEPENDENT_AMBULATORY_CARE_PROVIDER_SITE_OTHER): Payer: Medicare Other | Admitting: Internal Medicine

## 2022-07-27 ENCOUNTER — Encounter: Payer: Self-pay | Admitting: Internal Medicine

## 2022-07-27 VITALS — BP 138/88 | HR 74 | Temp 97.6°F | Ht 63.75 in | Wt 156.0 lb

## 2022-07-27 DIAGNOSIS — E039 Hypothyroidism, unspecified: Secondary | ICD-10-CM | POA: Diagnosis not present

## 2022-07-27 DIAGNOSIS — H8110 Benign paroxysmal vertigo, unspecified ear: Secondary | ICD-10-CM

## 2022-07-27 DIAGNOSIS — I10 Essential (primary) hypertension: Secondary | ICD-10-CM

## 2022-07-27 DIAGNOSIS — E02 Subclinical iodine-deficiency hypothyroidism: Secondary | ICD-10-CM | POA: Diagnosis not present

## 2022-07-27 DIAGNOSIS — Z1211 Encounter for screening for malignant neoplasm of colon: Secondary | ICD-10-CM

## 2022-07-27 DIAGNOSIS — N400 Enlarged prostate without lower urinary tract symptoms: Secondary | ICD-10-CM

## 2022-07-27 MED ORDER — LEVOTHYROXINE SODIUM 100 MCG PO TABS: 100.0000 ug | ORAL_TABLET | Freq: Every day | ORAL | 3 refills | Status: DC

## 2022-07-27 NOTE — Assessment & Plan Note (Signed)
BP Readings from Last 3 Encounters:  07/27/22 138/88  03/24/22 130/62  02/27/21 (!) 160/82   Controlled with lifestyle

## 2022-07-27 NOTE — Assessment & Plan Note (Signed)
Lab Results  Component Value Date   TSH 0.578 06/18/2022   Euthyroid on the levothyroxine 13mg

## 2022-07-27 NOTE — Progress Notes (Signed)
Subjective:    Patient ID: Richard Ibarra, male    DOB: 1945-11-29, 76 y.o.   MRN: [id number]  HPI Here for transfer of care  Doing well Exercises every day  Still gets episodic vertigo Not as bad Does fine with PRN meclizine  Past kidney stones and procedures Tended to get dehydrated Calcium stones---discussed citrate  Sees urologist PSA peaked at 10 On tamsulosin/finasteride----voids okay on this Tried stopping the tamsulosin---didn't do well without this  HTN in the past Hasn't needed medication since retiring and working out more (got orthostatic then with BP 80/40)  Continues on thyroid medication  Current Outpatient Medications on File Prior to Visit  Medication Sig Dispense Refill   fexofenadine (ALLEGRA) 180 MG tablet Take 1 tablet (180 mg total) by mouth daily. 90 tablet 3   finasteride (PROSCAR) 5 MG tablet TAKE 1 TABLET(5 MG) BY MOUTH DAILY 90 tablet 2   levothyroxine (SYNTHROID) 100 MCG tablet Take 1 tablet (100 mcg total) by mouth daily. 30 tablet 0   meclizine (ANTIVERT) 25 MG tablet Take 1tid prn dizziness (Plz sched visit with new provider) 30 tablet 0   niacin (NIASPAN) 1000 MG CR tablet TAKE 1 TABLET(1000 MG) BY MOUTH AT BEDTIME 90 tablet 3   rosuvastatin (CRESTOR) 10 MG tablet TAKE 1 TABLET BY MOUTH EVERYDAY AT BEDTIME 30 tablet 0   tamsulosin (FLOMAX) 0.4 MG CAPS capsule Take 1 capsule (0.4 mg total) by mouth daily. 30 capsule 0   Current Facility-Administered Medications on File Prior to Visit  Medication Dose Route Frequency Provider Last Rate Last Admin   opium-belladonna (B&O SUPPRETTES) suppository 1 suppository  1 suppository Rectal Once Carolan Clines, MD        Allergies  Allergen Reactions   Banana Swelling   Food Swelling    All melons     Past Medical History:  Diagnosis Date   Allergy    BCC (basal cell carcinoma of skin)    Chronic kidney disease    Elevated Creatine   Gross hematuria 10-30-11   story consistent with  exercise induced, eval by uro with 1.9cm stone L kidney   Hypertension    Hypothyroidism    Seasonal allergies 11-02-11   Tx. Allegra daily   Thyroid disease     Past Surgical History:  Procedure Laterality Date   CYSTOSCOPY  11/09/2011   Procedure: CYSTOSCOPY;  Surgeon: Ailene Rud, MD;  Location: [facility];  Service: Urology;  Laterality: N/A;   INGUINAL HERNIA REPAIR Right 02/14/2015   Procedure: RIGHT INGUINAL HERNIA REPAIR WITH MESH;  Surgeon: Erroll Luna, MD;  Location: [facility];  Service: General;  Laterality: Right;   INSERTION OF MESH Right 02/14/2015   Procedure: INSERTION OF MESH;  Surgeon: Erroll Luna, MD;  Location: [facility];  Service: General;  Laterality: Right;   PROSTATE BIOPSY  01/29/2006   Dr. Gaynelle Arabian   rash testing  02/17/1989   multiple positives    Family History  Problem Relation Age of Onset   Alzheimer's disease Mother    Cerebral aneurysm Father     Social History   Socioeconomic History   Marital status: Married    Spouse name: Rose   Number of children: 2   Years of education: PhD   Highest education level: Not on file  Occupational History   Occupation: Therapist, art: [employer]  Tobacco Use   Smoking status: Never    Passive exposure: Never  Smokeless tobacco: Never  Vaping Use   Vaping Use: Never used  Substance and Sexual Activity   Alcohol use: Yes    Comment: 2 glasses of wine a week   Drug use: Never   Sexual activity: Yes    Birth control/protection: Post-menopausal  Other Topics Concern   Not on file  Social History Narrative   02/01/20   From: [place] - originally but has been here for years   Living: with wife Kalman Shan (1998)   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      Family: [place] and [place], and [place] and Lavella Lemons (step daughters) - 37 grandchildren - Levada Dy in [place] otherwise kids are nearby      Enjoys: house chores,       Exercise: runs 1.5 miles every  other day, muscle strengthening the other days   Diet: tries to follow mediterranean diet       Safety   Seat belts: Yes    Guns: No   Safe in relationships: Yes       Has living will   Wife is health care POA---brother is alternate   Would accept resuscitation attempts   No tube feeds if cognitively aware   Social Determinants of Health   Financial Resource Strain: Low Risk  (03/12/2022)   Overall Financial Resource Strain (CARDIA)    Difficulty of Paying Living Expenses: Not hard at all  Food Insecurity: No Food Insecurity (03/12/2022)   Hunger Vital Sign    Worried About Running Out of Food in the Last Year: Never true    [place] in the Last Year: Never true  Transportation Needs: No Transportation Needs (03/12/2022)   PRAPARE - Hydrologist (Medical): No    Lack of Transportation (Non-Medical): No  Physical Activity: Sufficiently Active (03/12/2022)   Exercise Vital Sign    Days of Exercise per Week: 6 days    Minutes of Exercise per Session: 90 min  Stress: No Stress Concern Present (03/12/2022)   [place]    Feeling of Stress : Not at all  Social Connections: Not on file  Intimate Partner Violence: Not on file   Review of Systems Sleeps well Appetite is good--Mediterranean diet      Objective:   Physical Exam Constitutional:      Appearance: Normal appearance.  Cardiovascular:     Rate and Rhythm: Normal rate and regular rhythm.     Pulses: Normal pulses.     Heart sounds: No murmur heard.    No gallop.  Pulmonary:     Effort: Pulmonary effort is normal.     Breath sounds: Normal breath sounds. No wheezing or rales.  Abdominal:     Palpations: Abdomen is soft.     Tenderness: There is no abdominal tenderness.  Musculoskeletal:     Cervical back: Neck supple.     Right lower leg: No edema.     Left lower leg: No edema.  Lymphadenopathy:     Cervical: No  cervical adenopathy.  Neurological:     Mental Status: He is alert.  Psychiatric:        Mood and Affect: Mood normal.        Behavior: Behavior normal.            Assessment & Plan:

## 2022-07-27 NOTE — Assessment & Plan Note (Signed)
Doing well with tamsulosin 0.4 and finasteride '5mg'$  daily

## 2022-07-27 NOTE — Assessment & Plan Note (Signed)
Less severe now Uses the meclizine prn

## 2022-08-23 ENCOUNTER — Other Ambulatory Visit: Payer: Self-pay | Admitting: Internal Medicine

## 2022-08-23 DIAGNOSIS — E782 Mixed hyperlipidemia: Secondary | ICD-10-CM

## 2023-02-20 ENCOUNTER — Other Ambulatory Visit: Payer: Self-pay | Admitting: Internal Medicine

## 2023-02-20 DIAGNOSIS — E782 Mixed hyperlipidemia: Secondary | ICD-10-CM

## 2023-02-22 ENCOUNTER — Ambulatory Visit (INDEPENDENT_AMBULATORY_CARE_PROVIDER_SITE_OTHER): Payer: Medicare Other | Admitting: Family Medicine

## 2023-02-22 ENCOUNTER — Encounter: Payer: Self-pay | Admitting: Family Medicine

## 2023-02-22 VITALS — BP 130/66 | HR 82 | Temp 98.2°F | Ht 62.75 in | Wt 161.5 lb

## 2023-02-22 DIAGNOSIS — J069 Acute upper respiratory infection, unspecified: Secondary | ICD-10-CM

## 2023-02-22 DIAGNOSIS — J208 Acute bronchitis due to other specified organisms: Secondary | ICD-10-CM

## 2023-02-22 MED ORDER — BENZONATATE 200 MG PO CAPS: 200.0000 mg | ORAL_CAPSULE | Freq: Three times a day (TID) | ORAL | 1 refills | Status: DC | PRN

## 2023-02-22 MED ORDER — GUAIFENESIN-CODEINE 100-10 MG/5ML PO SOLN: 5.0000 mL | Freq: Three times a day (TID) | ORAL | 0 refills | Status: DC | PRN

## 2023-02-22 NOTE — Progress Notes (Signed)
Tore Carreker T. Khalise Billard, MD, CAQ Sports Medicine [facility] at [facility] [address] [place], [address]  Phone: [phone]  FAX: [phone]  Richard Ibarra - 77 y.o. male  MRN [id number]  Date of Birth: March 29, 1946  Date: 02/22/2023  PCP: Karie Schwalbe, MD  Referral: Karie Schwalbe, MD  Chief Complaint  Patient presents with   Cough    X 5 days-Negative home Covid test 2 days ago   Nasal Congestion   Chills   Subjective:   Richard Ibarra is a 77 y.o. very pleasant male patient with Body mass index is 28.84 kg/m. who presents with the following:  Resp infection for the last 5 days.  Cough and nasal congestion, head a little painful, lower sinuses.  Does have some headache and polyarthralgia. He does have some chills, but he does not have any fever or sweats.  No smoker and no lung history.  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   Covid negative  First has been sick in 6 years.   No fever    Review of Systems is noted in the HPI, as appropriate  Objective:   BP 130/66   Pulse 82   Temp 98.2 F (36.8 C) (Temporal)   Ht 5' 2.75" (1.594 m)   Wt 161 lb 8 oz (73.3 kg)   SpO2 96%   BMI 28.84 kg/m    Gen: WDWN, NAD. Globally Non-toxic HEENT: Throat clear, w/o exudate, R TM clear, L TM - good landmarks, No fluid present. rhinnorhea.  MMM Frontal sinuses: NT Max sinuses: NT NECK: Anterior cervical  LAD is absent CV: RRR, No M/G/R, cap refill <2 sec PULM: Breathing comfortably in no respiratory distress. no wheezing, crackles, rhonchi   Laboratory and Imaging Data:  Assessment and Plan:     ICD-10-CM   1. Viral URI  J06.9      Continue with supportive care.  Suggest cough medicines, other supportive care at the time.  Medication Management during today's office visit: Meds ordered this encounter  Medications   benzonatate (TESSALON) 200 MG capsule    Sig: Take 1 capsule (200 mg total) by mouth 3  (three) times daily as needed for cough.    Dispense:  40 capsule    Refill:  1   guaiFENesin-codeine 100-10 MG/5ML syrup    Sig: Take 5-10 mLs by mouth 3 (three) times daily as needed for cough.    Dispense:  120 mL    Refill:  0   There are no discontinued medications.  Orders placed today for conditions managed today: No orders of the defined types were placed in this encounter.   Disposition: No follow-ups on file.  Dragon [employer] One speech-to-text software was used for transcription in this dictation.  Possible transcriptional errors can occur using Animal nutritionist.   Signed,  Elpidio Galea. Allee Busk, MD   Outpatient Encounter Medications as of 02/22/2023  Medication Sig   benzonatate (TESSALON) 200 MG capsule Take 1 capsule (200 mg total) by mouth 3 (three) times daily as needed for cough.   guaiFENesin-codeine 100-10 MG/5ML syrup Take 5-10 mLs by mouth 3 (three) times daily as needed for cough.   fexofenadine (ALLEGRA) 180 MG tablet Take 1 tablet (180 mg total) by mouth daily.   finasteride (PROSCAR) 5 MG tablet TAKE 1 TABLET(5 MG) BY MOUTH DAILY   levothyroxine (SYNTHROID) 100 MCG tablet Take 1 tablet (100 mcg total) by mouth daily.   meclizine (  ANTIVERT) 25 MG tablet Take 1tid prn dizziness (Plz sched visit with new provider)   niacin (NIASPAN) 1000 MG CR tablet TAKE 1 TABLET(1000 MG) BY MOUTH AT BEDTIME   rosuvastatin (CRESTOR) 10 MG tablet TAKE 1 TABLET BY MOUTH EVERYDAY AT BEDTIME   tamsulosin (FLOMAX) 0.4 MG CAPS capsule Take 1 capsule (0.4 mg total) by mouth daily.   Facility-Administered Encounter Medications as of 02/22/2023  Medication   opium-belladonna (B&O SUPPRETTES) suppository 1 suppository

## 2023-02-23 ENCOUNTER — Encounter: Payer: Self-pay | Admitting: Family Medicine

## 2023-04-01 ENCOUNTER — Encounter: Payer: Self-pay | Admitting: Internal Medicine

## 2023-04-01 ENCOUNTER — Ambulatory Visit: Payer: Medicare Other | Admitting: Internal Medicine

## 2023-04-01 VITALS — BP 130/84 | HR 72 | Temp 97.9°F | Ht 64.0 in | Wt 155.0 lb

## 2023-04-01 DIAGNOSIS — I10 Essential (primary) hypertension: Secondary | ICD-10-CM

## 2023-04-01 DIAGNOSIS — E782 Mixed hyperlipidemia: Secondary | ICD-10-CM

## 2023-04-01 DIAGNOSIS — Z1211 Encounter for screening for malignant neoplasm of colon: Secondary | ICD-10-CM

## 2023-04-01 DIAGNOSIS — E039 Hypothyroidism, unspecified: Secondary | ICD-10-CM | POA: Diagnosis not present

## 2023-04-01 DIAGNOSIS — N401 Enlarged prostate with lower urinary tract symptoms: Secondary | ICD-10-CM | POA: Diagnosis not present

## 2023-04-01 DIAGNOSIS — Z0000 Encounter for general adult medical examination without abnormal findings: Secondary | ICD-10-CM | POA: Diagnosis not present

## 2023-04-01 MED ORDER — NIACIN ER (ANTIHYPERLIPIDEMIC) 1000 MG PO TBCR: EXTENDED_RELEASE_TABLET | ORAL | 3 refills | Status: DC

## 2023-04-01 NOTE — Assessment & Plan Note (Signed)
Seems euthyroid on levothyroxine daily

## 2023-04-01 NOTE — Assessment & Plan Note (Signed)
I have personally reviewed the Medicare Annual Wellness questionnaire and have noted 1. The patient's medical and social history 2. Their use of alcohol, tobacco or illicit drugs 3. Their current medications and supplements 4. The patient's functional ability including ADL's, fall risks, home safety risks and hearing or visual             impairment. 5. Diet and physical activities 6. Evidence for depression or mood disorders  The patients weight, height, BMI and visual acuity have been recorded in the chart I have made referrals, counseling and provided education to the patient based review of the above and I have provided the pt with a written personalized care plan for preventive services.  I have provided you with a copy of your personalized plan for preventive services. Please take the time to review along with your updated medication list.  Will do yearly FIT till 63 (hasn't had in a while) No PSA due to age Exercises regularly Updated COVID/flu---this fall RSV also

## 2023-04-01 NOTE — Assessment & Plan Note (Signed)
Still wants primary prevention with niacin and rosuvastatin

## 2023-04-01 NOTE — Assessment & Plan Note (Signed)
BP Readings from Last 3 Encounters:  04/01/23 130/84  02/22/23 130/66  07/27/22 138/88   Fine without Rx

## 2023-04-01 NOTE — Progress Notes (Signed)
Subjective:    Patient ID: Richard Ibarra, male    DOB: Sep 08, 1945, 77 y.o.   MRN: [id number]  HPI Here for Medicare wellness visit and follow up of chronic health conditions Reviewed advanced directives Reviewed other doctors--Dr Dingledein--ophthal, Dr Elenore Paddy, Dr Charlsie Merles, Dr Zada Girt No hospitalizations or surgery in the past year Exercises regularly Vision is fine Hearing is fine Occasional wine No tobacco No falls No depression or anhedonia Independent with instrumental ADLs No memory problems  Doing well Had URI a while back--but went away quickly No other concerns  Continues on thyroid meds Energy levels are good Weight stable  Continues on niacin and rosuvastatin Primary prevention  No chest pain or SOB No change in exercise tolerance  Continues on dual prostate meds Urinary stream is good Nocturia x 1  Current Outpatient Medications on File Prior to Visit  Medication Sig Dispense Refill   finasteride (PROSCAR) 5 MG tablet TAKE 1 TABLET(5 MG) BY MOUTH DAILY 90 tablet 2   levothyroxine (SYNTHROID) 100 MCG tablet Take 1 tablet (100 mcg total) by mouth daily. 100 tablet 3   niacin (NIASPAN) 1000 MG CR tablet TAKE 1 TABLET(1000 MG) BY MOUTH AT BEDTIME 90 tablet 3   rosuvastatin (CRESTOR) 10 MG tablet TAKE 1 TABLET BY MOUTH EVERYDAY AT BEDTIME 90 tablet 0   tamsulosin (FLOMAX) 0.4 MG CAPS capsule Take 1 capsule (0.4 mg total) by mouth daily. 30 capsule 0   Current Facility-Administered Medications on File Prior to Visit  Medication Dose Route Frequency Provider Last Rate Last Admin   opium-belladonna (B&O SUPPRETTES) suppository 1 suppository  1 suppository Rectal Once Jethro Bolus, MD        Allergies  Allergen Reactions   Banana Swelling   Food Swelling    All melons     Past Medical History:  Diagnosis Date   Allergy    BCC (basal cell carcinoma of skin)    Chronic kidney disease    Elevated Creatine   Gross hematuria  10-30-11   story consistent with exercise induced, eval by uro with 1.9cm stone L kidney   Hypertension    Hypothyroidism    Seasonal allergies 11-02-11   Tx. Allegra daily   Thyroid disease     Past Surgical History:  Procedure Laterality Date   CYSTOSCOPY  11/09/2011   Procedure: CYSTOSCOPY;  Surgeon: Kathi Ludwig, MD;  Location: [facility];  Service: Urology;  Laterality: N/A;   INGUINAL HERNIA REPAIR Right 02/14/2015   Procedure: RIGHT INGUINAL HERNIA REPAIR WITH MESH;  Surgeon: Harriette Bouillon, MD;  Location: [facility];  Service: General;  Laterality: Right;   INSERTION OF MESH Right 02/14/2015   Procedure: INSERTION OF MESH;  Surgeon: Harriette Bouillon, MD;  Location: [facility];  Service: General;  Laterality: Right;   PROSTATE BIOPSY  01/29/2006   Dr. Patsi Sears   rash testing  02/17/1989   multiple positives    Family History  Problem Relation Age of Onset   Alzheimer's disease Mother    Cerebral aneurysm Father     Social History   Socioeconomic History   Marital status: Married    Spouse name: Rose   Number of children: 2   Years of education: PhD   Highest education level: Not on file  Occupational History   Occupation: Doctor, hospital: [employer]  Tobacco Use   Smoking status: Never    Passive exposure: Never   Smokeless tobacco: Never  Vaping Use  Vaping status: Never Used  Substance and Sexual Activity   Alcohol use: Yes    Comment: 2 glasses of wine a week   Drug use: Never   Sexual activity: Yes    Birth control/protection: Post-menopausal  Other Topics Concern   Not on file  Social History Narrative   02/01/20   From: [place] - originally but has been here for years   Living: with wife Okey Dupre (1998)   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      Family: [place], and [place] and Kenney Houseman (step daughters) - 10 grandchildren - Marylene Land in [place] otherwise kids are nearby      Enjoys: house chores,        Exercise: runs 1.5 miles every other day, muscle strengthening the other days   Diet: tries to follow mediterranean diet        Has living will   Wife is health care POA---brother is alternate   Would accept resuscitation attempts   No tube feeds if cognitively aware   Social Determinants of Health   Financial Resource Strain: Low Risk  (03/12/2022)   Overall Financial Resource Strain (CARDIA)    Difficulty of Paying Living Expenses: Not hard at all  Food Insecurity: No Food Insecurity (03/12/2022)   Hunger Vital Sign    Worried About Running Out of Food in the Last Year: Never true    Ran Out of Food in the Last Year: Never true  Transportation Needs: No Transportation Needs (03/12/2022)   PRAPARE - Administrator, Civil Service (Medical): No    Lack of Transportation (Non-Medical): No  Physical Activity: Sufficiently Active (03/12/2022)   Exercise Vital Sign    Days of Exercise per Week: 6 days    Minutes of Exercise per Session: 90 min  Stress: No Stress Concern Present (03/12/2022)   [employer] of Occupational Health - Occupational Stress Questionnaire    Feeling of Stress : Not at all  Social Connections: Not on file  Intimate Partner Violence: Not on file   Review of Systems Appetite is good Sleeps well Wears seat belt Teeth are fine--regular with dentist Keeps up with derm due to past BCC No heartburn or dysphagia Bowels move fine--no blood No sig back or joint pains    Objective:   Physical Exam Constitutional:      Appearance: Normal appearance.  HENT:     Mouth/Throat:     Pharynx: No oropharyngeal exudate or posterior oropharyngeal erythema.  Eyes:     Conjunctiva/sclera: Conjunctivae normal.     Pupils: Pupils are equal, round, and reactive to light.  Cardiovascular:     Rate and Rhythm: Normal rate and regular rhythm.     Pulses: Normal pulses.     Heart sounds: No murmur heard.    No gallop.  Pulmonary:     Effort: Pulmonary  effort is normal.     Breath sounds: Normal breath sounds. No wheezing or rales.  Abdominal:     Palpations: Abdomen is soft.     Tenderness: There is no abdominal tenderness.  Musculoskeletal:     Cervical back: Neck supple.     Right lower leg: No edema.     Left lower leg: No edema.  Lymphadenopathy:     Cervical: No cervical adenopathy.  Skin:    Findings: No lesion or rash.  Neurological:     General: No focal deficit present.     Mental Status: He is alert and  oriented to person, place, and time.     Comments: Word naming 11/1 minute Recall 3/3  Psychiatric:        Mood and Affect: Mood normal.        Behavior: Behavior normal.            Assessment & Plan:

## 2023-04-01 NOTE — Addendum Note (Signed)
Addended by: Alvina Chou on: 04/01/2023 11:30 AM   Modules accepted: Orders

## 2023-04-01 NOTE — Assessment & Plan Note (Signed)
Voids well on the finasteride 5mg  and tamsulosin 0.4mg  daily

## 2023-04-01 NOTE — Progress Notes (Signed)
Hearing Screening - Comments:: Passed whisper test Vision Screening - Comments:: August 2023. Appt end of month.

## 2023-04-06 ENCOUNTER — Other Ambulatory Visit: Payer: Self-pay

## 2023-04-07 ENCOUNTER — Other Ambulatory Visit: Payer: Self-pay | Admitting: Internal Medicine

## 2023-04-07 DIAGNOSIS — E785 Hyperlipidemia, unspecified: Secondary | ICD-10-CM

## 2023-04-15 ENCOUNTER — Encounter: Payer: Self-pay | Admitting: Internal Medicine

## 2023-04-15 DIAGNOSIS — E039 Hypothyroidism, unspecified: Secondary | ICD-10-CM

## 2023-04-15 MED ORDER — LEVOTHYROXINE SODIUM 75 MCG PO TABS: 75.0000 ug | ORAL_TABLET | Freq: Every day | ORAL | 3 refills | Status: DC

## 2023-04-15 NOTE — Telephone Encounter (Signed)
Future Order placed for TSH to go to [facility]

## 2023-04-15 NOTE — Addendum Note (Signed)
Addended by: Eual Fines on: 04/15/2023 11:52 AM   Modules accepted: Orders

## 2023-05-22 ENCOUNTER — Other Ambulatory Visit: Payer: Self-pay | Admitting: Internal Medicine

## 2023-05-22 DIAGNOSIS — E782 Mixed hyperlipidemia: Secondary | ICD-10-CM

## 2023-12-01 ENCOUNTER — Other Ambulatory Visit (INDEPENDENT_AMBULATORY_CARE_PROVIDER_SITE_OTHER)

## 2023-12-01 ENCOUNTER — Other Ambulatory Visit: Payer: Self-pay | Admitting: Internal Medicine

## 2023-12-01 DIAGNOSIS — E039 Hypothyroidism, unspecified: Secondary | ICD-10-CM | POA: Diagnosis not present

## 2023-12-01 DIAGNOSIS — E785 Hyperlipidemia, unspecified: Secondary | ICD-10-CM

## 2023-12-01 NOTE — Addendum Note (Signed)
 Addended by: Alvina Chou on: 12/01/2023 02:52 PM   Modules accepted: Orders

## 2023-12-02 ENCOUNTER — Encounter: Payer: Self-pay | Admitting: Internal Medicine

## 2023-12-02 LAB — T4, FREE: Free T4: 1.38 ng/dL (ref 0.82–1.77)

## 2023-12-02 LAB — TSH: TSH: 3.68 u[IU]/mL (ref 0.450–4.500)

## 2023-12-02 LAB — LIPOPROTEIN A (LPA)

## 2023-12-03 ENCOUNTER — Encounter: Payer: Self-pay | Admitting: Internal Medicine

## 2023-12-03 LAB — FECAL OCCULT BLOOD, IMMUNOCHEMICAL: Fecal Occult Bld: NEGATIVE

## 2023-12-07 ENCOUNTER — Encounter: Payer: Self-pay | Admitting: Internal Medicine

## 2023-12-07 LAB — NMR LIPOPROFILE+IR MARKERS
HDL Particle Number: 29.9 umol/L — ABNORMAL LOW (ref >=30.5)
HDL Size: 9.6 nm (ref >=9.2)
LDL Particle Number: 1062 nmol/L — ABNORMAL HIGH (ref <1000)
LDL Size: 20.5 nm — ABNORMAL LOW (ref >20.5)
LDL Size: 20.5 nm — ABNORMAL LOW (ref >=20.8)
LP-IR Score: 50 — ABNORMAL HIGH (ref <=45)
Large HDL-P: 9.3 umol/L (ref >=4.8)
Large VLDL-P: 4 nmol/L — ABNORMAL HIGH (ref <=2.7)
Small LDL Particle Number: 390 nmol/L (ref <=527)
Small LDL-P: 390 nmol/L (ref <=527)
VLDL Size: 53.6 nm — ABNORMAL HIGH (ref <=46.6)

## 2023-12-07 LAB — SPECIMEN STATUS REPORT

## 2023-12-08 ENCOUNTER — Encounter: Payer: Self-pay | Admitting: Internal Medicine

## 2024-02-11 ENCOUNTER — Other Ambulatory Visit: Payer: Self-pay | Admitting: Internal Medicine

## 2024-02-11 DIAGNOSIS — E782 Mixed hyperlipidemia: Secondary | ICD-10-CM

## 2024-04-03 ENCOUNTER — Encounter: Payer: Self-pay | Admitting: Internal Medicine

## 2024-04-03 ENCOUNTER — Ambulatory Visit (INDEPENDENT_AMBULATORY_CARE_PROVIDER_SITE_OTHER): Payer: Medicare Other | Admitting: Internal Medicine

## 2024-04-03 VITALS — BP 136/88 | HR 74 | Ht 64.5 in | Wt 155.0 lb

## 2024-04-03 DIAGNOSIS — E039 Hypothyroidism, unspecified: Secondary | ICD-10-CM

## 2024-04-03 DIAGNOSIS — I10 Essential (primary) hypertension: Secondary | ICD-10-CM | POA: Diagnosis not present

## 2024-04-03 DIAGNOSIS — E782 Mixed hyperlipidemia: Secondary | ICD-10-CM

## 2024-04-03 DIAGNOSIS — Z0000 Encounter for general adult medical examination without abnormal findings: Secondary | ICD-10-CM | POA: Diagnosis not present

## 2024-04-03 DIAGNOSIS — E785 Hyperlipidemia, unspecified: Secondary | ICD-10-CM | POA: Diagnosis not present

## 2024-04-03 MED ORDER — ROSUVASTATIN CALCIUM 10 MG PO TABS: ORAL_TABLET | ORAL | 3 refills | Status: AC

## 2024-04-03 MED ORDER — OLMESARTAN MEDOXOMIL 20 MG PO TABS: 20.0000 mg | ORAL_TABLET | Freq: Every day | ORAL | 3 refills | Status: AC

## 2024-04-03 MED ORDER — LEVOTHYROXINE SODIUM 75 MCG PO TABS: 75.0000 ug | ORAL_TABLET | Freq: Every day | ORAL | 3 refills | Status: AC

## 2024-04-03 NOTE — Assessment & Plan Note (Signed)
 I have personally reviewed the Medicare Annual Wellness questionnaire and have noted 1. The patient's medical and social history 2. Their use of alcohol, tobacco or illicit drugs 3. Their current medications and supplements 4. The patient's functional ability including ADL's, fall risks, home safety risks and hearing or visual             impairment. 5. Diet and physical activities 6. Evidence for depression or mood disorders  The patients weight, height, BMI and visual acuity have been recorded in the chart I have made referrals, counseling and provided education to the patient based review of the above and I have provided the pt with a written personalized care plan for preventive services.  I have provided you with a copy of your personalized plan for preventive services. Please take the time to review along with your updated medication list.  FIT for the next couple of years---just done No PSA due to age Exercises regularly Flu/COVID vaccines soon

## 2024-04-03 NOTE — Assessment & Plan Note (Signed)
 Doing well with primary prevention on rosuvastatin  10

## 2024-04-03 NOTE — Progress Notes (Signed)
 Hearing Screening - Comments:: Passed whisper test Vision Screening - Comments:: October 2024

## 2024-04-03 NOTE — Assessment & Plan Note (Signed)
 BP Readings from Last 3 Encounters:  04/03/24 136/88  04/01/23 130/84  02/22/23 130/66   Needed to restart the olmesartan --but good on this

## 2024-04-03 NOTE — Progress Notes (Signed)
 Subjective:    Patient ID: Richard Ibarra, male    DOB: 1946/01/29, 78 y.o.   MRN: [id number]  HPI Here for Medicare wellness visit and follow up of chronic health conditions Reviewed advanced directives Reviewed other doctors---Dr Dingledein--ophthal, Dr Cyndy, Dr True, Dr Patrici No hospitalizations or surgery in the past year Daily moderate exercise--aerobic and resistance Regular wine with dinner--one at most No tobacco Vision is fine Hearing is good No falls No depression or anhedonia Independent with instrumental ADLs No sig memory issues  His BP is coming back up some Checks it every 2 weeks or so Multiple meds in the past--than it improved so he went off these Noticed it up for the past 6 weeks--so he went back on olmesartan  and it has come down well No dizziness  Voids well on the tamsulosin /finasteride  Does see urologist yearly  Continues on statin Did stop the niacin   No apparent thyroid  issues  Current Outpatient Medications on File Prior to Visit  Medication Sig Dispense Refill   finasteride  (PROSCAR ) 5 MG tablet TAKE 1 TABLET(5 MG) BY MOUTH DAILY 90 tablet 2   levothyroxine  (SYNTHROID ) 75 MCG tablet Take 1 tablet (75 mcg total) by mouth daily. 90 tablet 3   olmesartan  (BENICAR ) 20 MG tablet Take 20 mg by mouth daily.     rosuvastatin  (CRESTOR ) 10 MG tablet TAKE 1 TABLET BY MOUTH EVERYDAY AT BEDTIME 90 tablet 2   tamsulosin  (FLOMAX ) 0.4 MG CAPS capsule Take 1 capsule (0.4 mg total) by mouth daily. 30 capsule 0   No current facility-administered medications on file prior to visit.    Allergies  Allergen Reactions   Banana Swelling   Food Swelling    All melons     Past Medical History:  Diagnosis Date   Allergy    BCC (basal cell carcinoma of skin)    Chronic kidney disease    Elevated Creatine   Gross hematuria 10-30-11   story consistent with exercise induced, eval by uro with 1.9cm stone L kidney   Hypertension     Hypothyroidism    Seasonal allergies 11-02-11   Tx. Allegra  daily   Thyroid  disease     Past Surgical History:  Procedure Laterality Date   CYSTOSCOPY  11/09/2011   Procedure: CYSTOSCOPY;  Surgeon: Arlena LILLETTE Gal, MD;  Location: [facility];  Service: Urology;  Laterality: N/A;   INGUINAL HERNIA REPAIR Right 02/14/2015   Procedure: RIGHT INGUINAL HERNIA REPAIR WITH MESH;  Surgeon: Debby Shipper, MD;  Location: [facility];  Service: General;  Laterality: Right;   INSERTION OF MESH Right 02/14/2015   Procedure: INSERTION OF MESH;  Surgeon: Debby Shipper, MD;  Location: [facility];  Service: General;  Laterality: Right;   PROSTATE BIOPSY  01/29/2006   Dr. Gal   rash testing  02/17/1989   multiple positives    Family History  Problem Relation Age of Onset   Alzheimer's disease Mother    Cerebral aneurysm Father     Social History   Socioeconomic History   Marital status: Married    Spouse name: Rose   Number of children: 2   Years of education: PhD   Highest education level: Doctorate  Occupational History   Occupation: Doctor, hospital: [employer]  Tobacco Use   Smoking status: Never    Passive exposure: Never   Smokeless tobacco: Never  Vaping Use   Vaping status: Never Used  Substance and Sexual Activity  Alcohol use: Yes    Comment: 2 glasses of wine a week   Drug use: Never   Sexual activity: Yes    Birth control/protection: Post-menopausal  Other Topics Concern   Not on file  Social History Narrative   02/01/20   From: [place] - originally but has been here for years   Living: with wife Rumalda (1998)   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      Family: [place] and [place], and [place] and [place] (step daughters) - 10 grandchildren - Jon in [place] otherwise kids are nearby      Enjoys: house chores,       Exercise: runs 1.5 miles every other day, muscle strengthening the other days   Diet: tries to follow  mediterranean diet        Has living will   Wife is health care POA---brother is alternate   Would accept resuscitation attempts   No tube feeds if cognitively aware   Social Drivers of Corporate investment banker Strain: Low Risk  (03/30/2024)   Overall Financial Resource Strain (CARDIA)    Difficulty of Paying Living Expenses: Not hard at all  Food Insecurity: No Food Insecurity (03/30/2024)   Hunger Vital Sign    Worried About Running Out of Food in the Last Year: Never true    Ran Out of Food in the Last Year: Never true  Transportation Needs: No Transportation Needs (03/30/2024)   PRAPARE - Administrator, Civil Service (Medical): No    Lack of Transportation (Non-Medical): No  Physical Activity: Sufficiently Active (03/30/2024)   Exercise Vital Sign    Days of Exercise per Week: 7 days    Minutes of Exercise per Session: 60 min  Stress: No Stress Concern Present (03/30/2024)   [employer] of Occupational Health - Occupational Stress Questionnaire    Feeling of Stress: Not at all  Social Connections: Moderately Integrated (03/30/2024)   Social Connection and Isolation Panel    Frequency of Communication with Friends and Family: More than three times a week    Frequency of Social Gatherings with Friends and Family: Three times a week    Attends Religious Services: 1 to 4 times per year    Active Member of Clubs or Organizations: No    Attends Engineer, structural: Not on file    Marital Status: Married  Catering manager Violence: Not on file   Review of Systems Appetite is good Weight is stable Sleeps fine Wears seat belt Teeth are good--keeps up with dentist No chest pain or SOB No dizziness or syncope No suspicious skin lesions--has derm visit coming up No heartburn or dysphagia Bowels move fine No sig back or joint pains    Objective:   Physical Exam Constitutional:      Appearance: Normal appearance.  HENT:     Mouth/Throat:     Pharynx:  No oropharyngeal exudate or posterior oropharyngeal erythema.  Eyes:     Conjunctiva/sclera: Conjunctivae normal.     Pupils: Pupils are equal, round, and reactive to light.  Cardiovascular:     Rate and Rhythm: Normal rate and regular rhythm.     Pulses: Normal pulses.     Heart sounds: No murmur heard.    No gallop.  Pulmonary:     Effort: Pulmonary effort is normal.     Breath sounds: Normal breath sounds. No wheezing or rales.  Abdominal:     Palpations: Abdomen is soft.  Tenderness: There is no abdominal tenderness.  Musculoskeletal:     Cervical back: Neck supple.     Right lower leg: No edema.     Left lower leg: No edema.  Lymphadenopathy:     Cervical: No cervical adenopathy.  Skin:    Findings: No lesion or rash.  Neurological:     General: No focal deficit present.     Mental Status: He is alert and oriented to person, place, and time.     Comments: Mini-cog--normal  Psychiatric:        Mood and Affect: Mood normal.        Behavior: Behavior normal.            Assessment & Plan:

## 2024-04-03 NOTE — Assessment & Plan Note (Signed)
Seems to be euthyroid on levothyroxine 79mcg ?

## 2024-04-04 ENCOUNTER — Ambulatory Visit: Payer: Self-pay | Admitting: Internal Medicine

## 2024-04-04 LAB — COMPREHENSIVE METABOLIC PANEL WITH GFR
ALT: 9 IU/L (ref 0–44)
AST: 17 IU/L (ref 0–40)
Albumin: 4.6 g/dL (ref 3.8–4.8)
Alkaline Phosphatase: 81 IU/L (ref 44–121)
BUN/Creatinine Ratio: 14 (ref 10–24)
BUN: 16 mg/dL (ref 8–27)
Bilirubin Total: 0.4 mg/dL (ref 0.0–1.2)
CO2: 21 mmol/L (ref 20–29)
Calcium: 9.3 mg/dL (ref 8.6–10.2)
Chloride: 100 mmol/L (ref 96–106)
Creatinine, Ser: 1.16 mg/dL (ref 0.76–1.27)
Globulin, Total: 2.5 g/dL (ref 1.5–4.5)
Glucose: 100 mg/dL — ABNORMAL HIGH (ref 70–99)
Potassium: 4.3 mmol/L (ref 3.5–5.2)
Sodium: 134 mmol/L (ref 134–144)
Total Protein: 7.1 g/dL (ref 6.0–8.5)
eGFR: 64 mL/min/1.73 (ref >59)

## 2024-04-04 LAB — T4, FREE: Free T4: 1.66 ng/dL (ref 0.82–1.77)

## 2024-04-04 LAB — LIPID PANEL
Chol/HDL Ratio: 2.6 ratio (ref 0.0–5.0)
Cholesterol, Total: 146 mg/dL (ref 100–199)
HDL: 56 mg/dL (ref >39)
LDL Chol Calc (NIH): 73 mg/dL (ref 0–99)
Triglycerides: 91 mg/dL (ref 0–149)
VLDL Cholesterol Cal: 17 mg/dL (ref 5–40)

## 2024-04-04 LAB — NMR, LIPOPROFILE
Cholesterol, Total: 153 mg/dL (ref 100–199)
HDL Particle Number: 36.1 umol/L (ref >=30.5)
HDL-C: 57 mg/dL (ref >39)
LDL Particle Number: 987 nmol/L (ref <1000)
LDL Size: 20.8 nm (ref >20.5)
LDL-C (NIH Calc): 79 mg/dL (ref 0–99)
LP-IR Score: 49 — ABNORMAL HIGH (ref <=45)
Small LDL Particle Number: 497 nmol/L (ref <=527)
Triglycerides: 93 mg/dL (ref 0–149)

## 2024-04-04 LAB — CBC
Hematocrit: 48 % (ref 37.5–51.0)
Hemoglobin: 15.7 g/dL (ref 13.0–17.7)
MCH: 29.7 pg (ref 26.6–33.0)
MCHC: 32.7 g/dL (ref 31.5–35.7)
MCV: 91 fL (ref 79–97)
Platelets: 192 x10E3/uL (ref 150–450)
RBC: 5.29 x10E6/uL (ref 4.14–5.80)
RDW: 13.2 % (ref 11.6–15.4)
WBC: 7.5 x10E3/uL (ref 3.4–10.8)

## 2024-04-04 LAB — TSH: TSH: 1.05 u[IU]/mL (ref 0.450–4.500)

## 2024-07-06 ENCOUNTER — Telehealth: Payer: Self-pay

## 2024-07-06 NOTE — Telephone Encounter (Signed)
 Pt needs TOC appt in the next few months. Thank you.

## 2025-04-05 ENCOUNTER — Encounter
